# Patient Record
Sex: Male | Born: 1955
Health system: Southern US, Community
[De-identification: ages and names within clinical notes are randomized; demographics above are authoritative.]

## PROBLEM LIST (undated history)

## (undated) DIAGNOSIS — H269 Unspecified cataract: Secondary | ICD-10-CM

## (undated) DIAGNOSIS — G4730 Sleep apnea, unspecified: Secondary | ICD-10-CM

## (undated) DIAGNOSIS — C801 Malignant (primary) neoplasm, unspecified: Secondary | ICD-10-CM

## (undated) DIAGNOSIS — I10 Essential (primary) hypertension: Secondary | ICD-10-CM

## (undated) DIAGNOSIS — C8590 Non-Hodgkin lymphoma, unspecified, unspecified site: Secondary | ICD-10-CM

## (undated) HISTORY — DX: Sleep apnea, unspecified: G47.30

## (undated) HISTORY — DX: Non-Hodgkin lymphoma, unspecified, unspecified site: C85.90

## (undated) HISTORY — DX: Essential (primary) hypertension: I10

## (undated) HISTORY — PX: HERNIA REPAIR: SHX51

## (undated) HISTORY — DX: Unspecified cataract: H26.9

---

## 2004-10-31 ENCOUNTER — Encounter (INDEPENDENT_AMBULATORY_CARE_PROVIDER_SITE_OTHER): Payer: Self-pay | Admitting: *Deleted

## 2004-10-31 ENCOUNTER — Inpatient Hospital Stay (HOSPITAL_COMMUNITY): Admission: RE | Admit: 2004-10-31 | Discharge: 2004-11-02 | Payer: Self-pay | Admitting: Otolaryngology

## 2004-11-07 ENCOUNTER — Ambulatory Visit: Payer: Self-pay | Admitting: Oncology

## 2004-11-21 ENCOUNTER — Ambulatory Visit (HOSPITAL_COMMUNITY): Admission: RE | Admit: 2004-11-21 | Discharge: 2004-11-21 | Payer: Self-pay | Admitting: Oncology

## 2004-11-30 ENCOUNTER — Ambulatory Visit (HOSPITAL_COMMUNITY): Admission: RE | Admit: 2004-11-30 | Discharge: 2004-11-30 | Payer: Self-pay | Admitting: Oncology

## 2005-01-19 ENCOUNTER — Ambulatory Visit: Payer: Self-pay | Admitting: Oncology

## 2005-03-16 ENCOUNTER — Ambulatory Visit: Payer: Self-pay | Admitting: Oncology

## 2005-06-15 ENCOUNTER — Ambulatory Visit: Payer: Self-pay | Admitting: Oncology

## 2005-09-14 ENCOUNTER — Ambulatory Visit: Payer: Self-pay | Admitting: Oncology

## 2005-11-13 ENCOUNTER — Ambulatory Visit: Payer: Self-pay | Admitting: Oncology

## 2006-04-12 ENCOUNTER — Ambulatory Visit: Payer: Self-pay | Admitting: Oncology

## 2006-04-16 LAB — CBC WITH DIFFERENTIAL/PLATELET
BASO%: 0.3 % (ref 0.0–2.0)
Basophils Absolute: 0 10*3/uL (ref 0.0–0.1)
EOS%: 1.8 % (ref 0.0–7.0)
Eosinophils Absolute: 0.1 10*3/uL (ref 0.0–0.5)
HCT: 40.1 % (ref 38.7–49.9)
HGB: 13.8 g/dL (ref 13.0–17.1)
LYMPH%: 24.1 % (ref 14.0–48.0)
MCH: 31.6 pg (ref 28.0–33.4)
MCHC: 34.5 g/dL (ref 32.0–35.9)
MCV: 91.8 fL (ref 81.6–98.0)
MONO#: 0.5 10*3/uL (ref 0.1–0.9)
MONO%: 9.6 % (ref 0.0–13.0)
NEUT#: 3.3 10*3/uL (ref 1.5–6.5)
NEUT%: 64.2 % (ref 40.0–75.0)
Platelets: 236 10*3/uL (ref 145–400)
RBC: 4.37 10*6/uL (ref 4.20–5.71)
RDW: 13.6 % (ref 11.2–14.6)
WBC: 5.1 10*3/uL (ref 4.0–10.0)
lymph#: 1.2 10*3/uL (ref 0.9–3.3)

## 2006-08-09 ENCOUNTER — Ambulatory Visit: Payer: Self-pay | Admitting: Oncology

## 2006-08-13 LAB — CBC WITH DIFFERENTIAL/PLATELET
BASO%: 0.4 % (ref 0.0–2.0)
Basophils Absolute: 0 10*3/uL (ref 0.0–0.1)
EOS%: 3.1 % (ref 0.0–7.0)
Eosinophils Absolute: 0.1 10*3/uL (ref 0.0–0.5)
HCT: 42.3 % (ref 38.7–49.9)
HGB: 14.8 g/dL (ref 13.0–17.1)
LYMPH%: 30.9 % (ref 14.0–48.0)
MCH: 32 pg (ref 28.0–33.4)
MCHC: 34.9 g/dL (ref 32.0–35.9)
MCV: 91.6 fL (ref 81.6–98.0)
MONO#: 0.4 10*3/uL (ref 0.1–0.9)
MONO%: 12.4 % (ref 0.0–13.0)
NEUT#: 1.9 10*3/uL (ref 1.5–6.5)
NEUT%: 53.2 % (ref 40.0–75.0)
Platelets: 196 10*3/uL (ref 145–400)
RBC: 4.61 10*6/uL (ref 4.20–5.71)
RDW: 13.7 % (ref 11.2–14.6)
WBC: 3.6 10*3/uL — ABNORMAL LOW (ref 4.0–10.0)
lymph#: 1.1 10*3/uL (ref 0.9–3.3)

## 2006-08-13 LAB — LACTATE DEHYDROGENASE: LDH: 141 U/L (ref 94–250)

## 2006-12-05 ENCOUNTER — Ambulatory Visit: Payer: Self-pay | Admitting: Oncology

## 2006-12-10 LAB — CBC WITH DIFFERENTIAL/PLATELET
BASO%: 0.4 % (ref 0.0–2.0)
Basophils Absolute: 0 10*3/uL (ref 0.0–0.1)
EOS%: 2.6 % (ref 0.0–7.0)
Eosinophils Absolute: 0.1 10*3/uL (ref 0.0–0.5)
HCT: 43.7 % (ref 38.7–49.9)
HGB: 15 g/dL (ref 13.0–17.1)
LYMPH%: 28.7 % (ref 14.0–48.0)
MCH: 31.8 pg (ref 28.0–33.4)
MCHC: 34.3 g/dL (ref 32.0–35.9)
MCV: 92.8 fL (ref 81.6–98.0)
MONO#: 0.5 10*3/uL (ref 0.1–0.9)
MONO%: 11.2 % (ref 0.0–13.0)
NEUT#: 2.6 10*3/uL (ref 1.5–6.5)
NEUT%: 57.1 % (ref 40.0–75.0)
Platelets: 206 10*3/uL (ref 145–400)
RBC: 4.71 10*6/uL (ref 4.20–5.71)
RDW: 13.3 % (ref 11.2–14.6)
WBC: 4.6 10*3/uL (ref 4.0–10.0)
lymph#: 1.3 10*3/uL (ref 0.9–3.3)

## 2006-12-10 LAB — LACTATE DEHYDROGENASE: LDH: 144 U/L (ref 94–250)

## 2007-06-06 ENCOUNTER — Ambulatory Visit: Payer: Self-pay | Admitting: Oncology

## 2007-06-10 LAB — CBC WITH DIFFERENTIAL/PLATELET
Eosinophils Absolute: 0.1 10*3/uL (ref 0.0–0.5)
HCT: 39.2 % (ref 38.7–49.9)
LYMPH%: 26.2 % (ref 14.0–48.0)
MONO#: 0.4 10*3/uL (ref 0.1–0.9)
NEUT#: 2.6 10*3/uL (ref 1.5–6.5)
NEUT%: 61.1 % (ref 40.0–75.0)
Platelets: 190 10*3/uL (ref 145–400)
WBC: 4.3 10*3/uL (ref 4.0–10.0)

## 2007-10-08 ENCOUNTER — Ambulatory Visit: Payer: Self-pay | Admitting: Oncology

## 2007-10-13 LAB — COMPREHENSIVE METABOLIC PANEL
ALT: 16 U/L (ref 0–53)
BUN: 13 mg/dL (ref 6–23)
CO2: 25 mEq/L (ref 19–32)
Creatinine, Ser: 0.77 mg/dL (ref 0.40–1.50)
Total Bilirubin: 0.4 mg/dL (ref 0.3–1.2)

## 2007-10-13 LAB — CBC WITH DIFFERENTIAL/PLATELET
BASO%: 0.2 % (ref 0.0–2.0)
Basophils Absolute: 0 10*3/uL (ref 0.0–0.1)
HCT: 39.7 % (ref 38.7–49.9)
LYMPH%: 26.7 % (ref 14.0–48.0)
MCH: 32.4 pg (ref 28.0–33.4)
MCHC: 35.8 g/dL (ref 32.0–35.9)
MONO#: 0.4 10*3/uL (ref 0.1–0.9)
NEUT%: 59.3 % (ref 40.0–75.0)
Platelets: 183 10*3/uL (ref 145–400)
WBC: 4 10*3/uL (ref 4.0–10.0)

## 2007-10-13 LAB — LACTATE DEHYDROGENASE: LDH: 149 U/L (ref 94–250)

## 2008-01-09 ENCOUNTER — Ambulatory Visit: Payer: Self-pay | Admitting: Oncology

## 2008-03-31 ENCOUNTER — Ambulatory Visit: Payer: Self-pay | Admitting: Oncology

## 2008-03-31 LAB — CBC WITH DIFFERENTIAL/PLATELET
BASO%: 0.4 % (ref 0.0–2.0)
Basophils Absolute: 0 10*3/uL (ref 0.0–0.1)
EOS%: 1 % (ref 0.0–7.0)
Eosinophils Absolute: 0 10*3/uL (ref 0.0–0.5)
HCT: 40.9 % (ref 38.7–49.9)
HGB: 14.5 g/dL (ref 13.0–17.1)
LYMPH%: 24.8 % (ref 14.0–48.0)
MCH: 31.8 pg (ref 28.0–33.4)
MCHC: 35.4 g/dL (ref 32.0–35.9)
MCV: 89.8 fL (ref 81.6–98.0)
MONO#: 0.4 10*3/uL (ref 0.1–0.9)
MONO%: 10 % (ref 0.0–13.0)
NEUT#: 2.3 10*3/uL (ref 1.5–6.5)
NEUT%: 63.8 % (ref 40.0–75.0)
Platelets: 177 10*3/uL (ref 145–400)
RBC: 4.55 10*6/uL (ref 4.20–5.71)
RDW: 13.5 % (ref 11.2–14.6)
WBC: 3.6 10*3/uL — ABNORMAL LOW (ref 4.0–10.0)
lymph#: 0.9 10*3/uL (ref 0.9–3.3)

## 2008-04-01 LAB — COMPREHENSIVE METABOLIC PANEL
ALT: 20 U/L (ref 0–53)
AST: 21 U/L (ref 0–37)
Albumin: 4.7 g/dL (ref 3.5–5.2)
Alkaline Phosphatase: 41 U/L (ref 39–117)
BUN: 14 mg/dL (ref 6–23)
CO2: 22 mEq/L (ref 19–32)
Calcium: 9.6 mg/dL (ref 8.4–10.5)
Chloride: 106 mEq/L (ref 96–112)
Creatinine, Ser: 0.72 mg/dL (ref 0.40–1.50)
Glucose, Bld: 107 mg/dL — ABNORMAL HIGH (ref 70–99)
Potassium: 3.9 mEq/L (ref 3.5–5.3)
Sodium: 139 mEq/L (ref 135–145)
Total Bilirubin: 0.7 mg/dL (ref 0.3–1.2)
Total Protein: 7.4 g/dL (ref 6.0–8.3)

## 2008-04-01 LAB — LACTATE DEHYDROGENASE: LDH: 154 U/L (ref 94–250)

## 2008-04-01 LAB — HEPATITIS B SURFACE ANTIBODY,QUALITATIVE: Hep B S Ab: NEGATIVE

## 2008-04-01 LAB — HEPATITIS B SURFACE ANTIGEN: Hepatitis B Surface Ag: NEGATIVE

## 2008-04-01 LAB — HEPATITIS B CORE ANTIBODY, TOTAL: Hep B Core Total Ab: NEGATIVE

## 2008-04-05 ENCOUNTER — Ambulatory Visit (HOSPITAL_COMMUNITY): Admission: RE | Admit: 2008-04-05 | Discharge: 2008-04-05 | Payer: Self-pay | Admitting: Oncology

## 2008-04-23 LAB — CBC WITH DIFFERENTIAL/PLATELET
BASO%: 0.8 % (ref 0.0–2.0)
Basophils Absolute: 0 10*3/uL (ref 0.0–0.1)
EOS%: 3 % (ref 0.0–7.0)
Eosinophils Absolute: 0.1 10*3/uL (ref 0.0–0.5)
HCT: 41.4 % (ref 38.7–49.9)
HGB: 14.5 g/dL (ref 13.0–17.1)
LYMPH%: 25.2 % (ref 14.0–48.0)
MCH: 31.3 pg (ref 28.0–33.4)
MCHC: 34.9 g/dL (ref 32.0–35.9)
MCV: 89.7 fL (ref 81.6–98.0)
MONO#: 0.3 10*3/uL (ref 0.1–0.9)
MONO%: 9.5 % (ref 0.0–13.0)
NEUT#: 2.1 10*3/uL (ref 1.5–6.5)
NEUT%: 61.5 % (ref 40.0–75.0)
Platelets: 175 10*3/uL (ref 145–400)
RBC: 4.61 10*6/uL (ref 4.20–5.71)
RDW: 12.3 % (ref 11.2–14.6)
WBC: 3.4 10*3/uL — ABNORMAL LOW (ref 4.0–10.0)
lymph#: 0.9 10*3/uL (ref 0.9–3.3)

## 2008-04-23 LAB — PSA: PSA: 1.76 ng/mL (ref 0.10–4.00)

## 2008-05-18 ENCOUNTER — Ambulatory Visit: Payer: Self-pay | Admitting: Oncology

## 2008-05-20 LAB — CBC WITH DIFFERENTIAL/PLATELET
BASO%: 0.3 % (ref 0.0–2.0)
Basophils Absolute: 0 10*3/uL (ref 0.0–0.1)
EOS%: 2.3 % (ref 0.0–7.0)
Eosinophils Absolute: 0.1 10*3/uL (ref 0.0–0.5)
HCT: 40.9 % (ref 38.7–49.9)
HGB: 14.4 g/dL (ref 13.0–17.1)
LYMPH%: 28.8 % (ref 14.0–48.0)
MCH: 31.7 pg (ref 28.0–33.4)
MCHC: 35.1 g/dL (ref 32.0–35.9)
MCV: 90.4 fL (ref 81.6–98.0)
MONO#: 0.5 10*3/uL (ref 0.1–0.9)
MONO%: 13.1 % — ABNORMAL HIGH (ref 0.0–13.0)
NEUT#: 1.9 10*3/uL (ref 1.5–6.5)
NEUT%: 55.5 % (ref 40.0–75.0)
Platelets: 186 10*3/uL (ref 145–400)
RBC: 4.52 10*6/uL (ref 4.20–5.71)
RDW: 13.4 % (ref 11.2–14.6)
WBC: 3.5 10*3/uL — ABNORMAL LOW (ref 4.0–10.0)
lymph#: 1 10*3/uL (ref 0.9–3.3)

## 2008-06-25 ENCOUNTER — Inpatient Hospital Stay (HOSPITAL_COMMUNITY): Admission: EM | Admit: 2008-06-25 | Discharge: 2008-06-28 | Payer: Self-pay | Admitting: Emergency Medicine

## 2008-06-28 ENCOUNTER — Encounter (INDEPENDENT_AMBULATORY_CARE_PROVIDER_SITE_OTHER): Payer: Self-pay | Admitting: Gastroenterology

## 2008-07-19 ENCOUNTER — Ambulatory Visit: Payer: Self-pay | Admitting: Oncology

## 2008-07-20 LAB — CBC WITH DIFFERENTIAL/PLATELET
BASO%: 0.7 % (ref 0.0–2.0)
Basophils Absolute: 0 10*3/uL (ref 0.0–0.1)
EOS%: 4.2 % (ref 0.0–7.0)
Eosinophils Absolute: 0.1 10*3/uL (ref 0.0–0.5)
HCT: 35.1 % — ABNORMAL LOW (ref 38.7–49.9)
HGB: 12.2 g/dL — ABNORMAL LOW (ref 13.0–17.1)
LYMPH%: 23.6 % (ref 14.0–48.0)
MCH: 33.5 pg — ABNORMAL HIGH (ref 28.0–33.4)
MCHC: 34.8 g/dL (ref 32.0–35.9)
MCV: 96.2 fL (ref 81.6–98.0)
MONO#: 0.3 10*3/uL (ref 0.1–0.9)
MONO%: 11.7 % (ref 0.0–13.0)
NEUT#: 1.3 10*3/uL — ABNORMAL LOW (ref 1.5–6.5)
NEUT%: 59.8 % (ref 40.0–75.0)
Platelets: 172 10*3/uL (ref 145–400)
RBC: 3.65 10*6/uL — ABNORMAL LOW (ref 4.20–5.71)
RDW: 15.8 % — ABNORMAL HIGH (ref 11.2–14.6)
WBC: 2.2 10*3/uL — ABNORMAL LOW (ref 4.0–10.0)
lymph#: 0.5 10*3/uL — ABNORMAL LOW (ref 0.9–3.3)

## 2008-10-19 ENCOUNTER — Ambulatory Visit: Payer: Self-pay | Admitting: Oncology

## 2008-10-22 LAB — CBC WITH DIFFERENTIAL/PLATELET
BASO%: 0.4 % (ref 0.0–2.0)
Basophils Absolute: 0 10*3/uL (ref 0.0–0.1)
EOS%: 1.9 % (ref 0.0–7.0)
Eosinophils Absolute: 0.1 10*3/uL (ref 0.0–0.5)
HCT: 42.2 % (ref 38.7–49.9)
HGB: 14.6 g/dL (ref 13.0–17.1)
LYMPH%: 24.1 % (ref 14.0–48.0)
MCH: 31.2 pg (ref 28.0–33.4)
MCHC: 34.5 g/dL (ref 32.0–35.9)
MCV: 90.5 fL (ref 81.6–98.0)
MONO#: 0.4 10*3/uL (ref 0.1–0.9)
MONO%: 10.6 % (ref 0.0–13.0)
NEUT#: 2.3 10*3/uL (ref 1.5–6.5)
NEUT%: 63 % (ref 40.0–75.0)
Platelets: 183 10*3/uL (ref 145–400)
RBC: 4.67 10*6/uL (ref 4.20–5.71)
RDW: 13 % (ref 11.2–14.6)
WBC: 3.6 10*3/uL — ABNORMAL LOW (ref 4.0–10.0)
lymph#: 0.9 10*3/uL (ref 0.9–3.3)

## 2009-01-19 ENCOUNTER — Ambulatory Visit: Payer: Self-pay | Admitting: Oncology

## 2009-05-19 ENCOUNTER — Ambulatory Visit: Payer: Self-pay | Admitting: Oncology

## 2009-11-04 ENCOUNTER — Ambulatory Visit: Payer: Self-pay | Admitting: Oncology

## 2010-05-05 ENCOUNTER — Ambulatory Visit: Payer: Self-pay | Admitting: Oncology

## 2011-01-09 ENCOUNTER — Encounter (HOSPITAL_BASED_OUTPATIENT_CLINIC_OR_DEPARTMENT_OTHER): Payer: BC Managed Care – PPO | Admitting: Oncology

## 2011-01-09 DIAGNOSIS — C8299 Follicular lymphoma, unspecified, extranodal and solid organ sites: Secondary | ICD-10-CM

## 2011-04-10 NOTE — Consult Note (Signed)
[name], Derek Brown NO.:  [id number]   MEDICAL RECORD NO.:  [id number]          PATIENT TYPE:  EMS   LOCATION:  ED                           FACILITY:  [facility]   PHYSICIAN:  Llana Aliment. Randa Evens, M.D. DATE OF BIRTH:  11-02-1956   DATE OF CONSULTATION:  06/25/2008  DATE OF DISCHARGE:                                 CONSULTATION   Derek Brown presented to the [facility] Emergency Room this morning with  5+ episodes of bright red blood per rectum.  He has a history of non-  Hodgkin's lymphoma that has spread throughout his body cavity per his  wife.  He reports that he has never had any previous episodes of bloody  bowel movements.  He has had no abdominal pain, had no emesis but he  does report some chronic heartburn.   PAST MEDICAL HISTORY:  Significant for his non-Hodgkin's lymphoma first  diagnosed in 2005 as well as a recent diagnosis of BPH.  He was started  on IV lymphoma treatment in May.  So far the patient reports he has had  good results.   FAMILY HISTORY:  Significant for colon cancer in his grandfather as well  as ulcers in his uncles and cousins.   SOCIAL HISTORY:  He is married.  He exercises regularly.  He stopped  smoking over 10 years ago and denies any alcohol use.   ALLERGIES:  He has no known drug allergies.   CURRENT MEDICATIONS:  The patient does not remember his medications.  He  does say that he takes a daily 325 mg aspirin.  We called his primary  care physician, Dr. Lorenz Coaster, to obtain his list of medications.   REVIEW OF SYSTEMS:  Noncontributory.  He has had no recent illness.  No  heart palpitations.  No shortness of breath.  No pain.   PRIMARY CARE PHYSICIAN:  Dr. Leslee Home.   ONCOLOGIST:  Dr. Mancel Bale.   PHYSICAL EXAM:  His temperature is 97.5, pulse is 50, respirations are  20, blood pressure is 122/77.  He is alert and oriented, in no apparent distress.  He is pale and  mildly diaphoretic.  Heart is bradycardic with a  slight murmur.  Pulse is approximately 50.  Respirations are clear to auscultation.  Abdomen soft, nontender, nondistended with good bowel sounds.  Rectal was deferred.   The patient was found to be guaiac-positive by the ER physician.   CURRENT LABS:  Show a hemoglobin of 13.6, hematocrit 40, however, this  is before he received 2 IV boluses of normal saline with 20 mEq of  potassium.  Coags show a PT of 14.1, INR 1.1, BUN 13, creatinine 0.9,  platelets 214 thousand.   IMPRESSION:  Dr. Carman Ching has seen and examined the patient,  collected a history and reviewed his chart.  His impression is this is a  55 year old male with Hodgkin's lymphoma of currently experiencing a  lower gastrointestinal bleed, probably arterial and diverticular in  nature.  He presented to the ER orthostatic.  He is now in stable  condition.  Will  admit to the step-down unit.  Continue with supportive  care, IV hydration.  We will transfuse packed red blood cells as needed  and start Protonix.  We will plan for colonoscopy in the near future.  Dr. Dorena Cookey will see over the weekend.   Thanks very much for this consultation.      Stephani Police, PA    ______________________________  Llana Aliment Randa Evens, M.D.    MLY/MEDQ  D:  06/25/2008  T:  06/25/2008  Job:  [id number]   cc:   Fayrene Fearing L. Malon Kindle., M.D.  Fax: [phone]   Reuben Likes, M.D.  Fax: [phone]   Leighton Roach Truett Perna, M.D.  Fax: [phone]

## 2011-04-10 NOTE — Op Note (Signed)
[name], Derek Brown NO.:  [id number]   MEDICAL RECORD NO.:  [id number]          PATIENT TYPE:  INP   LOCATION:  1304                         FACILITY:  [facility]   PHYSICIAN:  Bernette Redbird, M.D.   DATE OF BIRTH:  [birth year]   DATE OF PROCEDURE:  06/28/2008  DATE OF DISCHARGE:                               OPERATIVE REPORT   PROCEDURE:  Colonoscopy with biopsy.   INDICATIONS:  A 55 year old gentleman who presented to the hospital  several days ago with a brisk lower GI bleed, on a baby aspirin daily,  history of non-Hodgkin's lymphoma apparently not felt to be relevant to  the current presentation based on his conversation with the patient's  oncologist.   Ironically, the patient was scheduled to have a screening colonoscopy  just a few days for now anyway.   FINDINGS:  Left side diverticulosis, mild to moderate.  No bleeding.  Diminutive sigmoid polyp.   PROCEDURE:  The risks of the procedure had been reviewed with the  patient who provided written consent, having undergone a prep as an  inpatient and having been brought from his hospital room to the  endoscopy unit.  Sedation was fentanyl 75 mcg and Versed 5 mg IV without  clinical instability, to a level of mild sedation.  Digital exam of the  prostate showed it to be smooth but perhaps slightly enlarged.   The Pentax pediatric video colonoscope was advanced around the colon to  the area just above the cecum, after which we were looping but this was  overcome by having the patient turn into the supine position.  The  terminal ileum was entered for short distance and appeared normal.  The  fecal effluent within it was completely nonbloody.   Pullback was then performed around the colon.  The quality of prep was  excellent and it is felt that all areas were adequately seen.   There was no blood whatsoever within the colonic lumen.   In the sigmoid region and to a lesser degree in the left:, the patient  had a fair number of diverticula, mostly small in size.   I did not see any alternative source of bleeding such as vascular  ectasia, colitis or large masses.   There was a diminutive polyp in the sigmoid colon removed by couple of  cold biopsies after probing it with forceps to make sure it was not an  inverted diverticulum.   Retroflexion in the rectum and reinspection of the rectum was  unremarkable.   The patient tolerated the procedure well and there no apparent  complications.   IMPRESSION.:  1. No active bleeding or blood present within the colonic or ileal      lumen at the time of this exam.  2. Mild to moderate sigmoid and left-sided diverticulosis,      constituting the probable origin of this patient's bleeding.  3. Diminutive sigmoid polyp biopsied.   PLAN:  1. Okay for discharge.  2. Await pathology on polyp with consideration for surveillance      colonoscopy in 5 years if the  polyp is adenomatous in character.           ______________________________  Bernette Redbird, M.D.     RB/MEDQ  D:  06/28/2008  T:  06/28/2008  Job:  [id number]   cc:   Jillyn Hidden B. Truett Perna, M.D.  Fax: [phone]   Reuben Likes, M.D.  Fax: [phone]

## 2011-04-10 NOTE — Discharge Summary (Signed)
[name], Derek Brown NO.:  [id number]   MEDICAL RECORD NO.:  [id number]          PATIENT TYPE:  INP   LOCATION:  1304                         FACILITY:  [facility]   PHYSICIAN:  Bernette Redbird, M.D.   DATE OF BIRTH:  [birth year]   DATE OF ADMISSION:  06/25/2008  DATE OF DISCHARGE:  06/28/2008                               DISCHARGE SUMMARY   ADMISSION DIAGNOSES:  1. Lower gastrointestinal bleed.  2. Non-Hodgkin's lymphoma.  3. Benign prostatic hypertrophy.   DISCHARGE DIAGNOSES:  1. Lower gastrointestinal bleed likely secondary to diverticulosis.  2. Non-Hodgkin's lymphoma.  3. Benign prostatic hypertrophy.   CONSULTATIONS:  None.   PROCEDURES:  Colonoscopy done June 28, 2008 by Dr. Molly Maduro Buccini findings include:  1. Mild to moderate left-sided diverticulosis.  2. Diminutive sigmoid polyp which was biopsied and pending.  3. No active bleeding or blood present in colonic or ileal lumen.   BRIEF HISTORY AND PHYSICAL/HOSPITAL COURSE:  The patient was admitted on  July 31.  He came to the [facility] ER, found to be orthostatic and  having had five to six bowel movements, painless bright red blood.  He  was bolused with IV fluids, given IV Protonix, admitted to step-down  unit.  He improved quickly overnight.  A regular diet was started on  August 1.  There was still some red blood per rectum,  but it was mixed  with brown stool.  At that time he underwent a colon prep on August 2  and colonoscopy today on August 3.  His hemoglobin bottomed out at 9.4.  He received no blood transfusions, but he was typed and crossed and  found to have O negative blood.  Labs on August 2 showed a hemoglobin of  5.6, hematocrit 27.68, white blood count 3.6, platelets 149,000, MCV  value 93.4.   CONDITION ON DISCHARGE:  Stable after colonoscopy.  Vital Signs:  Temperature 98, pulse 55, respirations 18, blood pressure is 117/71.   DISCHARGE INSTRUCTIONS:  1. No driving today.  2. Call [facility] office of [phone] with any signs of bleeding.  3. Light activity until after seen by Dr. Madilyn Fireman on August 7.  4. No aspirin until after seen by Dr. Madilyn Fireman on August 7.   DISCHARGE MEDICATIONS:  1. Allegra.  2. Vitamin C 1000 mg.  3. A prostate pill.  4. Aspirin 81 mg which will be held.   FOLLOW UP:  The patient has a follow-up office visit with Dr. Madilyn Fireman  August 7 at 3:30 p.m.      Stephani Police, PA    ______________________________  Bernette Redbird, M.D.    MLY/MEDQ  D:  06/28/2008  T:  06/28/2008  Job:  [id number]   cc:   Jillyn Hidden B. Truett Perna, M.D.  Fax: [phone]   Reuben Likes, M.D.  Fax: [phone]

## 2011-04-13 NOTE — Op Note (Signed)
[name], Derek Brown NO.:  [id number]   MEDICAL RECORD NO.:  [id number]          PATIENT TYPE:  OIB   LOCATION:  5735                         FACILITY:  [facility]   PHYSICIAN:  Hermelinda Medicus, M.D.   DATE OF BIRTH:  10-Aug-1956   DATE OF PROCEDURE:  10/31/2004  DATE OF DISCHARGE:                                 OPERATIVE REPORT   PREOPERATIVE DIAGNOSIS:  Hematoma of left postoperative parotid region.   POSTOPERATIVE DIAGNOSIS:  Hematoma of left postoperative parotid region.   OPERATION:  Exploration and removal of hematoma of left parotid region with  electrocoagulation using the bipolar and the Electrocap cautery to correct  any bleeding.   OPERATOR:  Hermelinda Medicus, M.D.   ANESTHESIA:  General endotracheal with Germaine Pomfret, M.D.   This patient in the early afternoon developed some swelling around his  parotid surgery region.  The JP apparently was not working and he developed  a hematoma.  We could not resolve this hematoma problem in the recovery  room, so we took him back to the operating room for resolution of this  problem.   PROCEDURE:  Patient placed in the supine position.  Under general  endotracheal anesthesia, he was intubated via flexible bronchoscopy  technique.  The patient then was prepped and draped using Betadine and the  usual head drape and neck drape was used.  The left parotid incision was  opened, stitches were removed, and a hematoma was found right at the  inferior aspect of the parotid surgery.  There was a small area of bleeding,  oozing in this area, and we electrocoagulated this area after finding the  branch of the facial nerve and guarding this very carefully.  We tested this  with a facial nerve stimulator to make absolutely sure we were seeing the  nerve accurately.  The superior and deeper areas were examined very  carefully, and no further bleeders were found.  There was some slight oozing  in certain areas superiorly  and at the deepest and most inferior aspect, and  these were electrocoagulated also.  The bipolar electrocautery was used  primarily and once this was brought under control, we irrigated the area  carefully.  We observed him also very carefully to make sure we had no  further bleeding problem.  The patient stated that he did have some  difficulties, having had a septal reconstruction several years ago, but it  was not a major problem with this.  Then looking underneath the inferior  laps, the anterior flap, and again in the inferior aspect, we irrigated and  found no further bleeding, we by sponge technique just to make sure there  was no evidence of any clotted bleeder in this area, and then we placed a  small Surgicel patch in this area where there was some bleeding on the bare  area of the parotid.  Once this was achieved, we then placed a new Al Pimple that was a better design and placed that in its original position, and  then we closed using chromic catgut 3-0 and  5-0 Ethilon.  The patient  tolerated the procedure very well and is doing well postoperatively.   His follow-up will be as an inpatient and then will be in five days and 10  days and two weeks, three weeks, and six weeks, three months, and a year.       JC/MEDQ  D:  10/31/2004  T:  11/01/2004  Job:  [id number]   cc:   Kristine Garbe. Ezzard Standing, M.D.  [address]  [place] [address]  Fax: [phone]

## 2011-04-13 NOTE — Op Note (Signed)
[name], Derek Brown NO.:  [id number]   MEDICAL RECORD NO.:  [id number]          PATIENT TYPE:  OIB   LOCATION:  5735                         FACILITY:  [facility]   PHYSICIAN:  Hermelinda Medicus, M.D.   DATE OF BIRTH:  [birth year]   DATE OF PROCEDURE:  DATE OF DISCHARGE:                                 OPERATIVE REPORT   PREOPERATIVE DIAGNOSES:  1.  Left parotid mass, inferior region.  2.  Right submaxillary gland region cervical node.   POSTOPERATIVE DIAGNOSES:  1.  Left parotid mass, inferior region.  2.  Right submaxillary gland region cervical node.   OPERATION PERFORMED:  1.  Left superficial parotidectomy with facial nerve dissection.  2.  Right submaxillary region node resection.   SURGEON:  Hermelinda Medicus, M.D.   ASSISTANT:  Kristine Garbe. Ezzard Standing, M.D.   ANESTHESIA:  General endotracheal with difficulty using the anesthesia  endoscope with Dr. Jean Rosenthal.   DESCRIPTION OF OPERATION:  With the patient in the supine position and under  general anesthesia where it was known that he was a difficult intubation, so  therefore the anesthesia laryngoscope was used and through this scope we  were able to intubate him without difficulty.  Once that was completed we  prepped for both sides; we prepped with Betadine.  We prepped his neck on  each side and then covered the right with a plan of the doing the  parotidectomy as the initial procedure.  The was exposed. The usual head  drape and neck drape, and operative drapes were used; and, the marking pen  was also used to set up the incisional site.   Then the parotid incision was carried out.  All hemostasis was established  with Bovie coagulation. We carried it down to the parotid.  We could easily  feel the inferior mass, which we felt was approximately 1.5 x 1.5 cm in size  at the inferior aspect.  We did our dissection posteriorly separating the  parotid tail away from the posterior scalenus muscles and we  elevated our  anterior facial flap.  We then found the tympanic bone of the mastoid and  the mastoid tip, and worked our way down the external ear canal cartilage  and bone down to the styloid process, and found the facial nerve.  Is was  carefully guarded and the dissection was then carried, watching the facial  nerve at all times, out laterally and anteriorly where we were able to work  around the tumor mass and guard the nerve very carefully.  We worked  anteriorly dissecting the abnormal tissue away inferiorly and anteriorly;  and, then watching the branches of the facial nerve we dissected the  anterior border.  We then carried the anterior parotid region to the tail of  the parotid and this was completely dissected not entering the tumor mass.   All hemostasis was established with Bovie electrocoagulation and bipolar  Bovie coagulation.  The branches of the nerve were all guarded and the  dissection of the superficial parotid was completed without.  All hemostasis  was then established  with Bovie electrocoagulation and the bipolar once  again.  The area was irrigated and then a #7 JP fluted drain was placed  through a separate incision.  This was sutured in place using 2-0 silk.  The  closure was then begun using chromic catgut 3-0 and then the skin was closed  using 5-0 Ethilon.  __________ completed the Steri-strips applied.  The  drain was secured and we then carefully turned the head back guarding the  drain and the incision to up the right side.  The angle of the mandible was  marked as well as the anterior mandible, midline, and then two  fingerbreadths below the level of the horizontal ramus an incision was made.  This was approximately 4 cm in size.  We worked our way down through the fat  and platysma and then worked our way directly towards the inferior  submaxillary gland.  The dissection was carried up along the lateral  submaxillary gland and then the node was found and  this was dissected free  without difficulty.  All hemostasis was established with the Bovie  electrocoagulation.  This was closed using a small Penrose drain.  Closure  was with 2-0 chromic catgut and 5-0 Ethilon.  Once this was completed  dressings were applied on both sides.   The patient was taken to the recovery room in good condition, facial nerve  branches in good condition and the patient doing well.   Follow up will be overnight as he has a history of sleep apnea, which has  already been corrected by surgery; and, we will just watch him because of  his Jackson-Pratt drain.   The patient tolerated the procedure well as noted above and is further  follow up will then be one wee, three weeks and six weeks.       JC/MEDQ  D:  10/31/2004  T:  11/01/2004  Job:  [id number]   cc:   Reuben Likes, M.D.  [address]  [place]  [place] [address]  Fax: [phone]   Kristine Garbe. Ezzard Standing, M.D.  [address]  [place] [address]  Fax: [phone]

## 2011-04-13 NOTE — Discharge Summary (Signed)
[name], Derek Brown NO.:  [id number]   MEDICAL RECORD NO.:  [id number]          PATIENT TYPE:  INP   LOCATION:                               FACILITY:  [facility]   PHYSICIAN:  Hermelinda Medicus, M.D.   DATE OF BIRTH:  June 13, 1956   DATE OF ADMISSION:  10/31/2004  DATE OF DISCHARGE:  11/02/2004                                 DISCHARGE SUMMARY   This patient is a 55 year old male who has had essentially two neck masses.  He had been on antibiotics on several occasions trying to treat this but  they have not changed.  The first one was a left inferior posterior parotid  mass, the second one in the right submaxillary that appears to be a lymph  node.  CAT scan was done showing an 11 x 13 mm circumscribed enhancing mass  in the superficial left parotid which has shown no change on antibiotic and  appears to be within the superficial parotid gland.  The right showed a 17 x  12 submandibular lymph node that was lateral to the submandibular gland and  was again unchanged on antibiotic care.  The patient thinks it is increasing  in size.  He had a small submental lymph nodes demonstrated that were 7 mm  or less.  Patient is in excellent health and biopsy excision was the next  plan.  The plan was to do a left parotidectomy, superficial parotidectomy,  and to do a right lymph node biopsy.  It was felt by CAT scan this would be  a primary parotid tumor, though lymphoma or other tumor could not be ruled  out.   PAST MEDICAL HISTORY:  Quite unremarkable.   ALLERGIES:  None.   SOCIAL HISTORY:  Does not drink or smoke.  He did smoke one pack a day, quit  in 1981.  He had a right inguinal and umbilical hernia repair eight to nine  years ago, had septal surgery, LAUP,  with a fairly difficult intubation,  with a short mandible about two years ago.   REVIEW OF SYSTEMS:  All within normal limits.  GU/GI within normal limits.  He has been in excellent health.  He does have the sleep  apnea history which  was well corrected two years ago.   PHYSICAL EXAMINATION:  VITAL SIGNS:  Blood pressure 129/79, heart rate 58,  weight 195, temperature 97.  HEENT:  Ears are clear.  Tympanic membranes are clear.  Nose is clear.  Septum is straight.  Oral cavity is clear.  True cords, false cords,  epiglottis, base of tongue are clear to ulceration or mass.  The nasopharynx  is clear.  True cord mobility, gag reflex, tongue mobility, EOMs, facial  nerve are all symmetrical.  No evidence of any laryngeal, base of tongue,  nasopharyngeal malignancy.  NECK:  Right submandibular region superficial node easily palpable, somewhat  mobile.  The neck shows an inferior posterior parotid mass that is fairly  small.  Feels like increasing size, part of the parotid gland unresponsive  to antibiotic.  No other thyromegaly or cervical  adenopathy or mass.  CHEST:  Clear.  No rales, rhonchi or wheezes.  CARDIOVASCULAR:  No rubs, murmurs or gallops.  ABDOMEN:  Unremarkable.  EXTREMITIES:  Unremarkable.   INITIAL DIAGNOSES:  Left parotid inferior region mass, right submaxillary  gland cervical node unresponsive to antibiotics.  Our plan is to do a  superficial parotidectomy and a right and left and a right submaxillary  gland node biopsy.  The patient underwent this surgery October 31, 2004.  He  had a malfunctioning JP drain and had some fluid collection beneath the left  parotid with a left parotid hematoma formation.  This was drained.  We re-  explored that parotid region, removed the hematoma, and used  electrocoagulation to correct any small hemorrhagic problems.  No real  notable bleeder was found.  The 7th nerve was intact pre surgery and post  surgery.  The patient did well postoperatively.  The new Jackson-Pratt that  was placed was removed on December 8 and the patient was discharged on that  day.  Patient doing very well.  Facial nerve intact.  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  The patient was referred to the radiation oncology and  chemotherapy department and is now under their care.  He has done very well.  I have followed him in the office.  Sutures were removed.  Facial nerve is  in good condition and he is continuing very well with his therapy.  His  laboratory work was quite unremarkable.  His EKG showed sinus bradycardia.  His urinalysis was within normal limits.  Hemoglobin 14.7, hematocrit 43.6,  white count 4.0.  The patient has done extremely well and would be expected  to do very well postoperatively and post radiation therapy.       ___________________________________________  Hermelinda Medicus, M.D.    JC/MEDQ  D:  01/02/2005  T:  01/02/2005  Job:  [id number]   cc:   Kristine Garbe. Ezzard Standing, M.D.  [address]  [place] [address]  Fax: [phone]   Elmer Sow. Dorna Bloom, M.D.  Fax: [phone]   Reuben Likes, M.D.  [address]  [place]  [place] [address]  Fax: [phone]

## 2011-04-13 NOTE — H&P (Signed)
NAME:  ACE, BERGFELD NO.:  [id number]   MEDICAL RECORD NO.:  [id number]          PATIENT TYPE:  OIB   LOCATION:  2899                         FACILITY:  [facility]   PHYSICIAN:  Hermelinda Medicus, M.D.   DATE OF BIRTH:  1956-02-12   DATE OF ADMISSION:  10/31/2004  DATE OF DISCHARGE:                                HISTORY & PHYSICAL   HISTORY OF PRESENT ILLNESS:  This patient is a 55 year old male who has had  essentially 2 neck masses.  He has been on antibiotics on several occasions,  trying to get these to shrink and they have essentially not changed.  His  first one is a left inferoposterior parotid mass and the second one is a  right submaxillary, what appears to be a node.  The CAT scan was done and it  shows an 11 x 13-mm circumscribed enhancing mass in the superficial left  parotid gland which has shown no change on antibiotic use and seemed to be  within the superficial parotid gland, the left.  He also has an enlarged 17  x 12-mm right submandibular lymph node which is lateral to the submandibular  gland, but this is 17 x 12 mm, is unchanged and he has palpated it on  several occasions too and thinks it is increasing in size.  Small submental  lymph nodes were also demonstrated, but all were 7 mm or less.  The patient  is in excellent health and now enters for removal of this right node for  biopsy purposes and the left one, he will have a left parotidectomy  inferiorly to remove this, what appears to be most like a Warthin's or mixed  tumor.   PAST HISTORY:  His past history is quite unremarkable.   ALLERGIES:  He has no allergies to medications.   SOCIAL HISTORY:  He does not drink; he did smoke 1 pack a day for 10 years,  quit in 1981.   PAST SURGICAL HISTORY:  1.  He has had a right inguinal and umbilical hernia repair 8-9 years ago.  2.  Had septal surgery and LAUP with a difficult intubation -- he has a fair      short mandible -- 2 years ago.   REVIEW OF SYSTEMS:  CARDIOVASCULAR, PULMONARY AND NEUROLOGICAL:  All within  normal limits.  GU AND GI:  Within normal limits.  He has done really quite  well.  He does have a history of sleep apnea which has been quite well-  corrected 2 years ago.   PHYSICAL EXAMINATION:  VITAL SIGNS:  On physical examination, blood pressure  is 129/79, heart rate 58, he weighs 195, temperature 97.  HEENT:  Ears are clear.  Tympanic membranes are clear.  The nose is clear.  The septum is straight now and his oral cavity is also clear.  True cords,  false cords, epiglottis and base of tongue are clear of ulceration or mass.  Nasopharynx is clear.  True cord mobility, gag reflex, tongue mobility,  EOMs, facial nerves are all symmetrical.  NECK:  The neck reveals a  right submandibular region superficial node quite  easily palpable and is somewhat mobile, but really quite firm.  The left  shows an inferoposterior parotid mass that is fairly small, but he feels is  increasing in size and is part of the parotid gland, unresponsive to  antibiotic.  The remainder of the neck is free of any thyromegaly or other  cervical adenopathy or other mass.  CHEST:  Chest is clear, no rales, rhonchi or wheezes.  CARDIOVASCULAR:  No opening snaps, murmurs or gallops.  ABDOMEN:  Abdomen free of any organomegaly, tenderness or mass.  EXTREMITIES:  Extremities unremarkable.   INITIAL DIAGNOSIS:  Left parotid inferior region mass with a right  submaxillary gland cervical node unresponsive to antibiotics.  The patient  is aware that the ramus mandibularis would be at risk on the right side when  we do this node biopsy and that the facial nerve on the left side would be  evaluated and will be close to our dissection on the left during the parotid  surgery.  The patient also has had anesthesia consult and will be intubated  with the anesthesia laryngoscope as he was recorded to be a difficult  intubation in the past.    DIAGNOSES:  1.  Right submaxillary gland lymphadenopathy.  2.  Left parotid mass.  3.  Difficult intubation.  4.  History of umbilical hernia.  5.  History of right inguinal hernia.  6.  History of nasal surgery, septal reconstruction and laser-assisted      uvulopalatoplasty 2 years ago.       JC/MEDQ  D:  10/31/2004  T:  10/31/2004  Job:  [id number]   cc:   Reuben Likes, M.D.  [address]  [place]  [place] [address]  Fax: [phone]

## 2011-08-24 LAB — DIFFERENTIAL
Eosinophils Relative: 1
Lymphocytes Relative: 20
Lymphs Abs: 2.1
Monocytes Absolute: 0.9
Monocytes Relative: 9

## 2011-08-24 LAB — CBC
HCT: 26.8 — ABNORMAL LOW
HCT: 28.7 — ABNORMAL LOW
HCT: 38.7 — ABNORMAL LOW
Hemoglobin: 10.1 — ABNORMAL LOW
Hemoglobin: 10.2 — ABNORMAL LOW
Hemoglobin: 13.4
Hemoglobin: 9.8 — ABNORMAL LOW
MCHC: 35
MCHC: 35.1
MCHC: 35.2
MCHC: 35.5
MCV: 92.9
MCV: 93.6
MCV: 93.4
MCV: 93.8
MCV: 93.9
Platelets: 162
Platelets: 149 — ABNORMAL LOW
Platelets: 157
Platelets: 164
RBC: 3.07 — ABNORMAL LOW
RBC: 3.15 — ABNORMAL LOW
RBC: 4.16 — ABNORMAL LOW
RBC: 2.96 — ABNORMAL LOW
RBC: 2.98 — ABNORMAL LOW
RBC: 2.98 — ABNORMAL LOW
RDW: 13.4
RDW: 13.8
RDW: 14
WBC: 10.5
WBC: 6.5
WBC: 8.4
WBC: 3.6 — ABNORMAL LOW
WBC: 3.7 — ABNORMAL LOW

## 2011-08-24 LAB — POCT I-STAT, CHEM 8
BUN: 13
Calcium, Ion: 1.14
Chloride: 105
Creatinine, Ser: 0.9
Glucose, Bld: 154 — ABNORMAL HIGH
HCT: 40

## 2011-08-24 LAB — COMPREHENSIVE METABOLIC PANEL
AST: 16
Albumin: 2.9 — ABNORMAL LOW
Chloride: 110
Creatinine, Ser: 0.76
GFR calc Af Amer: >60
Potassium: 4.2
Total Bilirubin: 0.5
Total Protein: 4.8 — ABNORMAL LOW

## 2011-08-24 LAB — PROTIME-INR: Prothrombin Time: 14.1

## 2011-08-24 LAB — LACTIC ACID, PLASMA: Lactic Acid, Venous: 1.8

## 2011-08-24 LAB — CROSSMATCH: ABO/RH(D): O NEG

## 2011-08-24 LAB — ABO/RH: ABO/RH(D): O NEG

## 2011-09-11 ENCOUNTER — Encounter (HOSPITAL_BASED_OUTPATIENT_CLINIC_OR_DEPARTMENT_OTHER): Payer: BC Managed Care – PPO | Admitting: Oncology

## 2011-09-11 DIAGNOSIS — Z23 Encounter for immunization: Secondary | ICD-10-CM

## 2011-09-11 DIAGNOSIS — C8299 Follicular lymphoma, unspecified, extranodal and solid organ sites: Secondary | ICD-10-CM

## 2012-01-24 ENCOUNTER — Other Ambulatory Visit: Payer: Self-pay | Admitting: Dermatology

## 2012-01-28 ENCOUNTER — Telehealth: Payer: Self-pay | Admitting: *Deleted

## 2012-01-28 NOTE — Telephone Encounter (Signed)
01/28/2012 10:41am Left message on patient's cell phone voice mail requesting that he call me back at [phone] for a status update, regarding any health problems or hospitalizations in the past three months.

## 2012-01-29 ENCOUNTER — Telehealth: Payer: Self-pay | Admitting: *Deleted

## 2012-01-29 NOTE — Telephone Encounter (Signed)
Received return call from patient this morning stating that he is doing very well. He has had no health problems, hospitalizations or new diagnosis of cancer in the past three months. Confirmed Derek Brown appointment with Dr. Truett Perna for Monday, June 17th at 10:30am.

## 2012-02-05 ENCOUNTER — Telehealth: Payer: Self-pay | Admitting: *Deleted

## 2012-02-05 NOTE — Telephone Encounter (Signed)
Patient called to report scalp lesion removed on 01/24/12 returned as lymphoma. Path report received today from Dr. Donzetta Starch showing low grade follicular lymphoma. Patient asking if he needs to be seen before his June 17 th appointment. Is having no s/s of lymphoma.

## 2012-05-12 ENCOUNTER — Ambulatory Visit (HOSPITAL_BASED_OUTPATIENT_CLINIC_OR_DEPARTMENT_OTHER): Payer: PRIVATE HEALTH INSURANCE | Admitting: Oncology

## 2012-05-12 VITALS — BP 114/67 | HR 50 | Temp 96.9°F | Ht 73.0 in | Wt 190.2 lb

## 2012-05-12 DIAGNOSIS — C8589 Other specified types of non-Hodgkin lymphoma, extranodal and solid organ sites: Secondary | ICD-10-CM

## 2012-05-12 NOTE — Progress Notes (Signed)
   [facility]    OFFICE PROGRESS NOTE   INTERVAL HISTORY:   He returns as scheduled. He feels well. No fever, night sweats, or anorexia. No palpable lymph nodes.  He noted a lesion at the left parietal scalp several months ago. Dr. Yetta Barre excised this lesion and the pathology returned consistent with lymphoma (we do not have the pathology report available today). He reports no regrowth of the lesion.  His wife reports that his snoring is worse.  Objective:  Vital signs in last 24 hours:  Blood pressure 114/67, pulse 50, temperature 96.9 F (36.1 C), temperature source Oral, height 6\' 1"  (1.854 m), weight 190 lb 3.2 oz (86.274 kg).    HEENT: Oropharynx without visible mass, neck without mass, no palpable or visible lesion over the parietal scalp Lymphatics: No cervical, supraclavicular, axillary, or inguinal nodes. Fullness in the left inguinal region without a discrete lymph node Resp: Lungs clear bilaterally Cardio: Regular rate and rhythm GI: No hepatosplenomegaly Vascular: No leg edema    Lab Results: None today   Medications: I have reviewed the patient's current medications.  Assessment/Plan: 1. Low-grade follicular lymphoma, status post 4 weeks of rituximab therapy.  The rituximab was last given on 04/30/2008.  The palpable lymphadenopathy resolved following rituximab therapy. 2. Benign prostatic hypertrophy. 3. Admission with a lower gastrointestinal bleed, status post a colonoscopy revealing diverticulosis in August 2009. 4. History of mild neutropenia related to rituximab. 5. Removal of a left parietal scalp lesion in 2013 with the pathology confirming non-Hodgkin's lymphoma   Disposition:  There is no clinical evidence for disease progression. He remains asymptomatic from the non-Hodgkin's lymphoma. I will followup on the pathology from the left scalp lesion. The plan is to continue an observation approach. Mr. Becker will return for an office  visit in 6 months. He will contact us in the interim for new symptoms.  He now has insurance through [facility]. It is cheaper for him to obtain medical care at [facility]. He would like to arrange for an oncology physician at [facility] in case he needs treatment in the future. We will make a referral to the medical oncology service at [facility].   Thornton Papas, MD  05/12/2012  5:13 PM

## 2012-05-14 ENCOUNTER — Telehealth: Payer: Self-pay | Admitting: Oncology

## 2012-05-14 NOTE — Telephone Encounter (Signed)
called pts lmovm for appts in dec2013. also took a copy of referral to medical recs to schedule appt @ [facility]

## 2012-05-15 ENCOUNTER — Telehealth: Payer: Self-pay | Admitting: Oncology

## 2012-05-15 NOTE — Telephone Encounter (Signed)
Pt scheduled to see Dr. Greggory Stallion 7/26 @ 2:45. Pt aware of appt.

## 2012-05-19 ENCOUNTER — Telehealth: Payer: Self-pay | Admitting: Oncology

## 2012-05-19 NOTE — Telephone Encounter (Signed)
Pt. Cannot make the 06/23/12 appt. With Dr. Greggory Stallion at [facility]. I gave him the [facility] phone number he is going to reschedule his appt.

## 2012-07-19 DIAGNOSIS — C8211 Follicular lymphoma grade II, lymph nodes of head, face, and neck: Secondary | ICD-10-CM | POA: Insufficient documentation

## 2012-07-30 ENCOUNTER — Telehealth: Payer: Self-pay | Admitting: *Deleted

## 2012-07-30 NOTE — Telephone Encounter (Signed)
Spoke with patient by phone today who states that his health has been well and that he has no new health problems to report. Patient states that he was referred to Dr. Greggory Stallion at [facility] and was seen about two weeks ago, with plans for routine follow-up visits to continue with Dr. Truett Perna. He would be referred back to [facility] should any treatment be needed, due to insurance cost differences.  Thanked patient for his participation in the study, noting that follow-up will be ending in approximately March 2014. Patient expressed an interest in learning more about results from the study; inquiry sent to site monitor at [employer] to inquire about information for patients.

## 2012-11-13 ENCOUNTER — Ambulatory Visit (HOSPITAL_BASED_OUTPATIENT_CLINIC_OR_DEPARTMENT_OTHER): Payer: PRIVATE HEALTH INSURANCE | Admitting: Oncology

## 2012-11-13 ENCOUNTER — Encounter: Payer: Self-pay | Admitting: *Deleted

## 2012-11-13 VITALS — BP 140/77 | HR 50 | Temp 96.7°F | Resp 20 | Ht 73.0 in | Wt 186.9 lb

## 2012-11-13 DIAGNOSIS — C8299 Follicular lymphoma, unspecified, extranodal and solid organ sites: Secondary | ICD-10-CM

## 2012-11-13 DIAGNOSIS — C8589 Other specified types of non-Hodgkin lymphoma, extranodal and solid organ sites: Secondary | ICD-10-CM

## 2012-11-13 DIAGNOSIS — N400 Enlarged prostate without lower urinary tract symptoms: Secondary | ICD-10-CM

## 2012-11-13 NOTE — Progress Notes (Signed)
   [facility]    OFFICE PROGRESS NOTE   INTERVAL HISTORY:   He returns as scheduled. No new complaint. No evidence for recurrence of the scalp lesion. He denies fever, night sweats, and anorexia. No palpable lymph nodes.  Objective:  Vital signs in last 24 hours:  Blood pressure 140/77, pulse 50, temperature 96.7 F (35.9 C), temperature source Oral, resp. rate 20, height 6\' 1"  (1.854 m), weight 186 lb 14.4 oz (84.777 kg).    HEENT: No evidence of a lesion near the site of the scalp biopsy Lymphatics: No cervical, supraclavicular, or axillary nodes.? 1-2 cm left inguinal node. Resp: Lungs clear bilaterally Cardio: Regular rate and rhythm GI: No hepatosplenomegaly Vascular: No leg edema      Medications: I have reviewed the patient's current medications.  Assessment/Plan: 1. Low-grade follicular lymphoma, status post 4 weeks of rituximab therapy. The rituximab was last given on 04/30/2008. The palpable lymphadenopathy resolved following rituximab therapy. 2. Benign prostatic hypertrophy. 3. Admission with a lower gastrointestinal bleed, status post a colonoscopy revealing diverticulosis in August 2009. 4. History of mild neutropenia related to rituximab. 5. Removal of a left parietal scalp lesion in February 2013 with the pathology confirming non-Hodgkin's lymphoma-low-grade 2/3 follicular lymphoma  Disposition:  He remains in clinical remission from non-Hodgkin's lymphoma. The plan is to continue an observation approach. Mr. Chuba will return for an office visit in 6 months.   Thornton Papas, MD  11/13/2012  6:16 PM

## 2012-11-13 NOTE — Progress Notes (Signed)
Patient in to clinic today for routine scheduled visit. Met with patient briefly to explain that data collection for the research study will end by February 23, 2013 as originally scheduled. Patient is aware that he may receive another contact from the research nurse regarding his condition, for the final quarterly assessment. Thanked patient for his participation in the research study.

## 2012-11-14 ENCOUNTER — Telehealth: Payer: Self-pay | Admitting: Oncology

## 2012-11-14 NOTE — Telephone Encounter (Signed)
s.w. pt and gv appt schedule for June 2014.Marland KitchenMarland KitchenMarland KitchenMarland Kitchenpt aware   By Collier Salina

## 2013-02-12 ENCOUNTER — Telehealth: Payer: Self-pay | Admitting: *Deleted

## 2013-02-12 NOTE — Telephone Encounter (Signed)
Received return phone call and voice mail message from patient. Contacted patient via cell phone to confirm that he was aware of upcoming study closure as noted in the patient letter sent out this month. Patient states his health is well and he has no new hospitalizations, assessments or new cancers to report. Thanked patient again for his participation in the study. Patient inquired about his next appointment and was told that he is scheduled to see Dr. Truett Perna on Friday, June 20th at 10:30am.

## 2013-02-12 NOTE — Telephone Encounter (Signed)
Left messages at patient's home and on cell phone voice mail, requesting a final contact before the end of the month, to follow-up regarding patient status and end of [employer] Study follow-up, effective March 31st. Requested that patient return my call at [phone].

## 2013-05-15 ENCOUNTER — Ambulatory Visit (HOSPITAL_BASED_OUTPATIENT_CLINIC_OR_DEPARTMENT_OTHER): Payer: PRIVATE HEALTH INSURANCE | Admitting: Oncology

## 2013-05-15 ENCOUNTER — Telehealth: Payer: Self-pay | Admitting: Oncology

## 2013-05-15 ENCOUNTER — Ambulatory Visit: Payer: PRIVATE HEALTH INSURANCE | Admitting: Oncology

## 2013-05-15 VITALS — BP 142/80 | HR 44 | Temp 97.0°F | Resp 18 | Ht 73.0 in | Wt 190.0 lb

## 2013-05-15 DIAGNOSIS — C8589 Other specified types of non-Hodgkin lymphoma, extranodal and solid organ sites: Secondary | ICD-10-CM

## 2013-05-15 NOTE — Progress Notes (Signed)
   [facility]    OFFICE PROGRESS NOTE   INTERVAL HISTORY:   He returns as scheduled. He feels well. No complaint. No palpable lymph nodes. No scalp mass. No fever or night sweats. Good energy level.  Objective:  Vital signs in last 24 hours:  Blood pressure 142/80, pulse 44, temperature 97 F (36.1 C), temperature source Oral, resp. rate 18, height 6\' 1"  (1.854 m), weight 190 lb (86.183 kg).    HEENT: No palpable mass at the parietal scalp. Neck without mass, oropharynx without visible mass Lymphatics: No cervical, supraclavicular, axillary, or right inguinal nodes.? 1-2 cm left inguinal node Resp: Lungs clear bilaterally Cardio: Regular rate and rhythm GI: No hepatosplenomegaly Vascular: No leg edema   Medications: I have reviewed the patient's current medications.  Assessment/Plan: 1. Low-grade follicular lymphoma, status post 4 weeks of rituximab therapy. The rituximab was last given on 04/30/2008. The palpable lymphadenopathy resolved following rituximab therapy. 2. Benign prostatic hypertrophy. 3. Admission with a lower gastrointestinal bleed, status post a colonoscopy revealing diverticulosis in August 2009. 4. History of mild neutropenia related to rituximab. 5. Removal of a left parietal scalp lesion in February 2013 with the pathology confirming non-Hodgkin's lymphoma-low-grade 2/3 follicular lymphoma 6. Chronic bradycardia  Disposition:  He remains in clinical remission from non-Hodgkin's lymphoma. He will return for an office visit in 8 months. Mr. Towson will contact us in the interim for new symptoms.   Thornton Papas, MD  05/15/2013  5:41 PM

## 2013-05-15 NOTE — Telephone Encounter (Signed)
s.w. pt and advied in on Feb appt...pt ok and awaer

## 2014-01-15 ENCOUNTER — Telehealth: Payer: Self-pay | Admitting: Oncology

## 2014-01-15 ENCOUNTER — Ambulatory Visit (HOSPITAL_BASED_OUTPATIENT_CLINIC_OR_DEPARTMENT_OTHER): Payer: Managed Care, Other (non HMO) | Admitting: Oncology

## 2014-01-15 ENCOUNTER — Telehealth: Payer: Self-pay | Admitting: *Deleted

## 2014-01-15 VITALS — BP 152/85 | HR 50 | Temp 97.3°F | Resp 18 | Ht 73.0 in | Wt 194.3 lb

## 2014-01-15 DIAGNOSIS — I498 Other specified cardiac arrhythmias: Secondary | ICD-10-CM

## 2014-01-15 DIAGNOSIS — N400 Enlarged prostate without lower urinary tract symptoms: Secondary | ICD-10-CM

## 2014-01-15 DIAGNOSIS — C8299 Follicular lymphoma, unspecified, extranodal and solid organ sites: Secondary | ICD-10-CM

## 2014-01-15 DIAGNOSIS — C8589 Other specified types of non-Hodgkin lymphoma, extranodal and solid organ sites: Secondary | ICD-10-CM | POA: Insufficient documentation

## 2014-01-15 NOTE — Telephone Encounter (Signed)
lvm for pt regarding to Feb 2016 appt...mailed appt sched avs and letter

## 2014-01-15 NOTE — Telephone Encounter (Signed)
Called and informed patient last pneumococcal vaccine was 08/2011.  Per Dr. Benay Spice.  Patient verbalized understanding.

## 2014-01-15 NOTE — Progress Notes (Signed)
   [facility]    OFFICE PROGRESS NOTE   INTERVAL HISTORY:   He returns for scheduled followup of non-Hodgkin's lymphoma. He feels well. Good appetite and energy level. No palpable lymph nodes. The scalp nodule has not returned.  Objective:  Vital signs in last 24 hours:  Blood pressure 152/85, pulse 50, temperature 97.3 F (36.3 C), temperature source Oral, resp. rate 18, height 6\' 1"  (1.854 m), weight 194 lb 4.8 oz (88.134 kg), SpO2 99.00%.    HEENT: Neck without mass, no left scalp mass. Lymphatics: No cervical, supraclavicular, axillary, or right inguinal nodes. Slight fullness in the lateral left inguinal canal without a discrete lymph node. Resp: Lungs clear bilaterally Cardio: Regular rate and rhythm GI: No hepatosplenomegaly, nontender, no mass Vascular: No leg edema    Medications: I have reviewed the patient's current medications.  Assessment/Plan: 1. Low-grade follicular lymphoma, status post 4 weeks of rituximab therapy. The rituximab was last given on 04/30/2008. The palpable lymphadenopathy resolved following rituximab therapy. 2. Benign prostatic hypertrophy. 3. Admission with a lower gastrointestinal bleed, status post a colonoscopy revealing diverticulosis in August 2009. 4. History of mild neutropenia related to rituximab. 5. Removal of a left parietal scalp lesion in February 2013 with the pathology confirming non-Hodgkin's lymphoma-low-grade 2/3 follicular lymphoma 6. Chronic bradycardia  Disposition:  He remains in clinical remission from non-Hodgkins lymphoma. He will return for an office visit in one year. Derek Brown will contact us in the interim for new symptoms or palpable lymphadenopathy. He will stay up-to-date on the influenza and pneumococcal vaccines. He last had a pneumococcal vaccine in October of 2012.   Derek Coder, MD  01/15/2014  4:00 PM

## 2014-03-17 ENCOUNTER — Other Ambulatory Visit: Payer: Self-pay | Admitting: Gastroenterology

## 2014-07-26 DIAGNOSIS — N401 Enlarged prostate with lower urinary tract symptoms: Secondary | ICD-10-CM | POA: Insufficient documentation

## 2014-07-26 DIAGNOSIS — I861 Scrotal varices: Secondary | ICD-10-CM | POA: Insufficient documentation

## 2014-07-26 DIAGNOSIS — K4090 Unilateral inguinal hernia, without obstruction or gangrene, not specified as recurrent: Secondary | ICD-10-CM | POA: Insufficient documentation

## 2014-07-26 HISTORY — DX: Unilateral inguinal hernia, without obstruction or gangrene, not specified as recurrent: K40.90

## 2014-10-08 ENCOUNTER — Telehealth: Payer: Self-pay | Admitting: Oncology

## 2014-10-08 NOTE — Telephone Encounter (Signed)
Lvm advising appt chg from 2/19 ([facility]) to 2/22 @ 8.30am. Also mailed revised appt calendar.

## 2014-12-29 ENCOUNTER — Telehealth: Payer: Self-pay | Admitting: Oncology

## 2014-12-29 NOTE — Telephone Encounter (Signed)
S/w pt confirming MD visit r/s for 02/22 from 02/19.... KJ

## 2015-01-14 ENCOUNTER — Ambulatory Visit: Payer: Managed Care, Other (non HMO) | Admitting: Oncology

## 2015-01-17 ENCOUNTER — Telehealth: Payer: Self-pay | Admitting: Oncology

## 2015-01-17 ENCOUNTER — Ambulatory Visit (HOSPITAL_BASED_OUTPATIENT_CLINIC_OR_DEPARTMENT_OTHER): Payer: BLUE CROSS/BLUE SHIELD | Admitting: Oncology

## 2015-01-17 VITALS — BP 144/91 | HR 46 | Temp 98.1°F | Resp 20 | Ht 73.0 in | Wt 186.6 lb

## 2015-01-17 DIAGNOSIS — C8590 Non-Hodgkin lymphoma, unspecified, unspecified site: Secondary | ICD-10-CM

## 2015-01-17 NOTE — Telephone Encounter (Signed)
Pt confirmed MD visit per 02/22 POF, gave pt AVS... KJ  °

## 2015-01-17 NOTE — Progress Notes (Signed)
  [facility] OFFICE PROGRESS NOTE   Diagnosis: Non-Hodgkin's lymphoma  INTERVAL HISTORY:   He returns as scheduled. He feels well. No fever, night sweats, or palpable lymph nodes. He noted a tiny lesion at the right upper neck last year that he thinks may be a "pimple ".  Objective:  Vital signs in last 24 hours:  Blood pressure 144/91, pulse 46, temperature 98.1 F (36.7 C), temperature source Oral, resp. rate 20, height 6\' 1"  (1.854 m), weight 186 lb 9.6 oz (84.641 kg), SpO2 100 %. repeat manual blood pressure 152/82    HEENT: Neck without mass Lymphatics: No cervical, supraclavicular, axillary, or inguinal nodes, slight fullness in the left inguinal region without a discrete lymph node Resp: Lungs clear bilaterally Cardio: Regular rate and rhythm GI: No hepatosplenomegaly Vascular: No leg edema  Skin: One to 2 mm pimple-type skin lesion at the right upper neck near the mandible, no mass . Left parietal scalp without a palpable mass.   Medications: I have reviewed the patient's current medications.  Assessment/Plan: 1. Low-grade follicular lymphoma, status post 4 weeks of rituximab therapy. The rituximab was last given on 04/30/2008. The palpable lymphadenopathy resolved following rituximab therapy. 2. Benign prostatic hypertrophy. 3. Admission with a lower gastrointestinal bleed, status post a colonoscopy revealing diverticulosis in August 2009. 4. History of mild neutropenia related to rituximab. 5. Removal of a left parietal scalp lesion in February 2013 with the pathology confirming non-Hodgkin's lymphoma-low-grade 2/3 follicular lymphoma 6. Chronic bradycardia  Disposition:  Derek Brown remains in clinical remission from non-Hodgkin's lymphoma. He will return for an office visit in one year. He will contact us in the interim for new symptoms or palpable lymph nodes. I recommended he obtain the 13 valent pneumococcal vaccine from his primary  physician.  Betsy Coder, MD  01/17/2015  9:25 AM

## 2016-01-19 ENCOUNTER — Ambulatory Visit: Payer: BLUE CROSS/BLUE SHIELD | Admitting: Oncology

## 2016-01-20 ENCOUNTER — Other Ambulatory Visit: Payer: Self-pay | Admitting: *Deleted

## 2016-01-20 ENCOUNTER — Telehealth: Payer: Self-pay | Admitting: Oncology

## 2016-01-20 NOTE — Telephone Encounter (Signed)
S/w pt confirming MD visit per 02/24 POF.... Cherylann Banas

## 2016-01-23 ENCOUNTER — Ambulatory Visit (HOSPITAL_BASED_OUTPATIENT_CLINIC_OR_DEPARTMENT_OTHER): Payer: 59 | Admitting: Oncology

## 2016-01-23 VITALS — BP 162/82 | HR 48 | Temp 97.8°F | Resp 17 | Ht 73.0 in | Wt 186.3 lb

## 2016-01-23 DIAGNOSIS — C8208 Follicular lymphoma grade I, lymph nodes of multiple sites: Secondary | ICD-10-CM

## 2016-01-23 NOTE — Progress Notes (Signed)
  [facility] OFFICE PROGRESS NOTE   Diagnosis: Non-Hodgkin's lymphoma  INTERVAL HISTORY:   Mr.Derek Brown returns for a scheduled visit. He feels well. No palpable lymph nodes. No fever, night sweats, or anorexia. He reports recently receiving influenza and pneumococcal vaccines. He had a right inguinal hernia repair in the past. He has noted increased fullness in this area.  Objective:  Vital signs in last 24 hours:  Blood pressure 162/82, pulse 48, temperature 97.8 F (36.6 C), temperature source Oral, resp. rate 17, height 6\' 1"  (1.854 m), weight 186 lb 4.8 oz (84.505 kg), SpO2 99 %.    HEENT:  neck without mass, parietal scalp without mass Lymphatics:  no cervical, supraclavicular, axillary, or inguinal nodes Resp:  lungs clear bilaterally Cardio:  regular rate and rhythm GI:  no hepatomegaly, no mass, nontender, slight soft fullness in the right inguinal area without a mass or lymph node. GU: Testes without mass Vascular:  no leg edema    Medications: I have reviewed the patient's current medications.  Assessment/Plan: 1. Low-grade follicular lymphoma diagnosed on biopsy of a right submandibular lymph node and left intraparotid node December 2005. Treated with rituximab, last given on 04/30/2008. The palpable lymphadenopathy resolved following rituximab therapy. 2. Benign prostatic hypertrophy. 3. Admission with a lower gastrointestinal bleed, status post a colonoscopy revealing diverticulosis in August 2009. 4. History of mild neutropenia related to rituximab. 5. Removal of a left parietal scalp lesion in February 2013 with the pathology confirming non-Hodgkin's lymphoma-low-grade 2/3 follicular lymphoma 6. Chronic bradycardia    Disposition:   Mr. [name] remains in clinical remission from non-Hodgkin's lymphoma. He will ask his urologist to evaluate the right inguinal area. He may have recurrence of a hernia.  He will return for an office visit in  one-year. He will contact us in the interim for new symptoms.  Betsy Coder, MD  01/23/2016  9:19 AM

## 2016-01-24 ENCOUNTER — Telehealth: Payer: Self-pay | Admitting: Oncology

## 2016-01-24 NOTE — Telephone Encounter (Signed)
cld pt and left amessage of appt for 12/2016 @ 11:30

## 2016-07-23 DIAGNOSIS — R972 Elevated prostate specific antigen [PSA]: Secondary | ICD-10-CM | POA: Insufficient documentation

## 2016-09-20 ENCOUNTER — Encounter (HOSPITAL_COMMUNITY): Payer: Self-pay

## 2016-09-20 ENCOUNTER — Emergency Department (HOSPITAL_COMMUNITY)
Admission: EM | Admit: 2016-09-20 | Discharge: 2016-09-20 | Disposition: A | Discharge status: Home / Self Care | Payer: 59 | Attending: Emergency Medicine | Admitting: Emergency Medicine

## 2016-09-20 ENCOUNTER — Emergency Department (HOSPITAL_COMMUNITY): Payer: 59

## 2016-09-20 DIAGNOSIS — S065XAA Traumatic subdural hemorrhage with loss of consciousness status unknown, initial encounter: Secondary | ICD-10-CM

## 2016-09-20 DIAGNOSIS — Z8589 Personal history of malignant neoplasm of other organs and systems: Secondary | ICD-10-CM | POA: Diagnosis not present

## 2016-09-20 DIAGNOSIS — S065X9A Traumatic subdural hemorrhage with loss of consciousness of unspecified duration, initial encounter: Secondary | ICD-10-CM

## 2016-09-20 DIAGNOSIS — Z7982 Long term (current) use of aspirin: Secondary | ICD-10-CM | POA: Diagnosis not present

## 2016-09-20 DIAGNOSIS — I6200 Nontraumatic subdural hemorrhage, unspecified: Secondary | ICD-10-CM | POA: Diagnosis not present

## 2016-09-20 DIAGNOSIS — R200 Anesthesia of skin: Secondary | ICD-10-CM | POA: Diagnosis present

## 2016-09-20 HISTORY — DX: Malignant (primary) neoplasm, unspecified: C80.1

## 2016-09-20 LAB — CBC
HCT: 43.4 % (ref 39.0–52.0)
Hemoglobin: 14.4 g/dL (ref 13.0–17.0)
MCH: 31.4 pg (ref 26.0–34.0)
MCHC: 33.2 g/dL (ref 30.0–36.0)
MCV: 94.8 fL (ref 78.0–100.0)
PLATELETS: 169 10*3/uL (ref 150–400)
RBC: 4.58 MIL/uL (ref 4.22–5.81)
RDW: 13.4 % (ref 11.5–15.5)
WBC: 5.1 10*3/uL (ref 4.0–10.5)

## 2016-09-20 LAB — DIFFERENTIAL
BASOS PCT: 0 %
Basophils Absolute: 0 10*3/uL (ref 0.0–0.1)
EOS PCT: 2 %
Eosinophils Absolute: 0.1 10*3/uL (ref 0.0–0.7)
Lymphocytes Relative: 23 %
Lymphs Abs: 1.2 10*3/uL (ref 0.7–4.0)
MONO ABS: 0.4 10*3/uL (ref 0.1–1.0)
Monocytes Relative: 8 %
Neutro Abs: 3.4 10*3/uL (ref 1.7–7.7)
Neutrophils Relative %: 67 %

## 2016-09-20 LAB — COMPREHENSIVE METABOLIC PANEL
ALT: 18 U/L (ref 17–63)
ANION GAP: 7 (ref 5–15)
AST: 21 U/L (ref 15–41)
Albumin: 4.3 g/dL (ref 3.5–5.0)
Alkaline Phosphatase: 45 U/L (ref 38–126)
BUN: 11 mg/dL (ref 6–20)
CHLORIDE: 104 mmol/L (ref 101–111)
CO2: 27 mmol/L (ref 22–32)
Calcium: 9.4 mg/dL (ref 8.9–10.3)
Creatinine, Ser: 0.6 mg/dL — ABNORMAL LOW (ref 0.61–1.24)
GFR calc non Af Amer: >60 mL/min (ref >60)
Glucose, Bld: 114 mg/dL — ABNORMAL HIGH (ref 65–99)
Potassium: 3.9 mmol/L (ref 3.5–5.1)
SODIUM: 138 mmol/L (ref 135–145)
Total Bilirubin: 0.7 mg/dL (ref 0.3–1.2)
Total Protein: 7.1 g/dL (ref 6.5–8.1)

## 2016-09-20 LAB — PROTIME-INR
INR: 1.09
PROTHROMBIN TIME: 14.1 s (ref 11.4–15.2)

## 2016-09-20 LAB — APTT: aPTT: 37 seconds — ABNORMAL HIGH (ref 24–36)

## 2016-09-20 LAB — I-STAT TROPONIN, ED: Troponin i, poc: 0 ng/mL (ref 0.00–0.08)

## 2016-09-20 MED ORDER — LEVETIRACETAM 500 MG PO TABS: 500.0000 mg | ORAL_TABLET | Freq: Two times a day (BID) | ORAL | 0 refills | Status: DC

## 2016-09-20 MED ORDER — LEVETIRACETAM 500 MG PO TABS
500.0000 mg | ORAL_TABLET | Freq: Once | ORAL | Status: AC
  Administered 2016-09-20: 500 mg via ORAL
  Filled 2016-09-20: qty 1

## 2016-09-20 NOTE — ED Notes (Signed)
Pt states  He understands instructions. Home stable with wife and with steady gait.

## 2016-09-20 NOTE — ED Triage Notes (Signed)
Patient here with 4 days of intermittent visual changes, arm numbness and today trouble speaking. States that he feels as if he is having a hard time making his tongue work. No pain, no headache, no gait disturbance. Alert and oriented

## 2016-09-20 NOTE — ED Notes (Signed)
Pt returns from [facility].

## 2016-09-20 NOTE — ED Notes (Signed)
Pt transported to CT ?

## 2016-09-20 NOTE — Discharge Instructions (Signed)
Follow up with Dr. Diamantina Monks in 1 week for re-evaluation. Take Keppra as prescribed. Return to the ED immediately if you experience severe worsening of your symptoms, severe headache, chest pain, seizure like activity or change in your neurological status.

## 2016-09-20 NOTE — ED Notes (Signed)
Pt returns from radiology. 

## 2016-09-20 NOTE — ED Provider Notes (Signed)
[facility] DEPT Provider Note   CSN: [id number] Arrival date & time: 09/20/16  1145     History   Chief Complaint Chief Complaint  Patient presents with  . numbness intermittent 4 days    HPI Derek Brown is a 60 y.o. male with pmhx of lymphoma, in remission who presents to the ED today c/o tingling/numbness on the left side of his body. Pt states that 4 days ago he noticed a numb sensation in his left hand. He states that it began as a tingling sensation and then progressed to where he could not feel what he was touching in his hand. This sensation was intermittent since then and he began experiencing this in his left leg, foot, left flank and the left side of his face. Pt also reports a change in his vision out of his left eye statin that "it look like I'm looking through bevelled glass". Pt went to UC yesterday and states that they did a bedside carotid ultrasound that was negative and discharged him to home. This morning when he woke up he tried to answer the phone but was unable to get the words out. Pt states that "I knew what I wanted to say but I couldn't say them". He went to he PCP and was sent here with concern for stroke. Pt does not take any blood thinners. Denies CP, SOB, syncope.  HPI  Past Medical History:  Diagnosis Date  . Cancer [facility])     Patient Active Problem List   Diagnosis Date Noted  . Other malignant lymphomas, unspecified site, extranodal and solid organ sites 01/15/2014    History reviewed. No pertinent surgical history.     Home Medications    Prior to Admission medications   Medication Sig Start Date End Date Taking? Authorizing Provider  aspirin EC 81 MG tablet Take 81 mg by mouth daily.   Yes Historical Provider, MD  fexofenadine (ALLEGRA) 180 MG tablet Take 180 mg by mouth as needed.   Yes Historical Provider, MD  vitamin C (ASCORBIC ACID) 500 MG tablet Take 500 mg by mouth daily.   Yes Historical Provider, MD    Family History No  family history on file.  Social History Social History  Substance Use Topics  . Smoking status: Never Smoker  . Smokeless tobacco: Never Used  . Alcohol use No     Allergies   Review of patient's allergies indicates no known allergies.   Review of Systems Review of Systems  All other systems reviewed and are negative.    Physical Exam Updated Vital Signs BP 139/77   Pulse (!) 53   Temp 97.8 F (36.6 C)   Resp 18   Ht 6\' 1"  (1.854 m)   Wt 85.7 kg   SpO2 97%   BMI 24.94 kg/m   Physical Exam  Constitutional: He is oriented to person, place, and time. He appears well-developed and well-nourished. No distress.  HENT:  Head: Normocephalic and atraumatic.  Nose: Nose normal.  Mouth/Throat: No oropharyngeal exudate.  Eyes: Conjunctivae and EOM are normal. Pupils are equal, round, and reactive to light. Right eye exhibits no discharge. Left eye exhibits no discharge. No scleral icterus.  Cardiovascular: Normal rate, regular rhythm, normal heart sounds and intact distal pulses.  Exam reveals no gallop and no friction rub.   No murmur heard. Pulmonary/Chest: Effort normal and breath sounds normal. No respiratory distress. He has no wheezes. He has no rales. He exhibits no tenderness.  Abdominal: Soft. He  exhibits no distension. There is no tenderness. There is no guarding.  Musculoskeletal: Normal range of motion. He exhibits no edema.  Neurological: He is alert and oriented to person, place, and time. He has normal reflexes. No cranial nerve deficit.  Strength 5/5 throughout. Pt reports decreased sensation in left hand vs right to dullness. Normal sensation to sharp. Good grip strength b/l. No gait abnormality. No dysmetria. No slurred speech. No facial droop. Negative pronator drift.    Skin: Skin is warm and dry. No rash noted. He is not diaphoretic. No erythema. No pallor.  Psychiatric: He has a normal mood and affect. His behavior is normal.  Nursing note and vitals  reviewed.    ED Treatments / Results  Labs (all labs ordered are listed, but only abnormal results are displayed) Labs Reviewed  APTT - Abnormal; Notable for the following:       Result Value   aPTT 37 (*)    All other components within normal limits  COMPREHENSIVE METABOLIC PANEL - Abnormal; Notable for the following:    Glucose, Bld 114 (*)    Creatinine, Ser 0.60 (*)    All other components within normal limits  PROTIME-INR  CBC  DIFFERENTIAL  I-STAT TROPOININ, ED    EKG  EKG Interpretation None       Radiology Ct Head Wo Contrast  Result Date: 09/20/2016 CLINICAL DATA:  Slurred speech, weakness for 4 days EXAM: CT HEAD WITHOUT CONTRAST TECHNIQUE: Contiguous axial images were obtained from the base of the skull through the vertex without intravenous contrast. COMPARISON:  None available FINDINGS: Brain: Bilateral mixed density subdural hematomas noted over the cerebral hemispheres, roughly measuring 15 mm in thickness on both sides. The subdural hematomas appear acute on chronic. Mild mass-effect over the cerebral convexities without midline shift, herniation, or hydrocephalus. No interventricular or subarachnoid hemorrhage appreciated. Ventricles are symmetric. Basilar cisterns are patent. No acute infarction or mass lesion. Vascular: No hyperdense vessel or unexpected calcification. Skull: Normal. Negative for fracture or focal lesion. Sinuses/Orbits: No acute finding. Other: None. IMPRESSION: Bilateral mixed density subdural hematomas over the cerebral hemispheres appearing acute on chronic. Measurements as above. These results were called by telephone at the time of interpretation on 09/20/2016 at 1:04 pm to Dr. Merrily Pew , who verbally acknowledged these results. Electronically Signed   By: Jerilynn Mages.  Shick M.D.   On: 09/20/2016 13:05    Procedures Procedures (including critical care time)  Medications Ordered in ED Medications  levETIRAcetam (KEPPRA) tablet 500 mg (not  administered)     Initial Impression / Assessment and Plan / ED Course  I have reviewed the triage vital signs and the nursing notes.  Pertinent labs & imaging results that were available during my care of the patient were reviewed by me and considered in my medical decision making (see chart for details).  Clinical Course   Otherwise healthy 60 y.o m presents to the ED today with c/o intermittent numbness in left hand/leg and face. Today also reports brief episode of expressive aphasia. Sent by PCP with concern for stroke. On presentation to ED, pt appears well. No crania nerve deficit on exam. Pt does report some decreased sensation in left hand vs right with dull touch. Otherwise no neuro deficits. Strength 5/5 throughout. Lab work unremarkable. CT reveals bilateral mixed density subdural hematomas over the cerebral hemispheres appearing acute on chronic. Pt states that he hit his head several times while at work when he was in his 20's-30's but denies any  recent falls or head injury. Unclear etiology of CT findings. Pt is not anticoagulated.  Spoke with Dr. Diamantina Monks with neuro surgery who reviewed pts images and states that pt may also be experiencing [place] seizures. Pt placed on Keppra 500mg  BID. Dr. Diamantina Monks feels that pt may be safe for discharge with follow up in the clinic with him in 1 week. They will likely repeat CT imaging at that time to follow the subdural bleeds.   I have discussed treatment plan with pt and wife who are agreeable. I have given VERY STRICT return precautions, discussed thoroughly. Pt appears very reliable and wil return to the ED immediately if he has new or worsening symptoms.   Case discussed with Dr. Dayna Barker who agrees with treatment plan.  Final Clinical Impressions(s) / ED Diagnoses   Final diagnoses:  Subdural hematoma [facility])    New Prescriptions New Prescriptions   No medications on file     Carlos Levering, PA-C 09/21/16  [id number]    Merrily Pew, MD 09/21/16 [phone]

## 2016-11-30 ENCOUNTER — Other Ambulatory Visit: Payer: Self-pay | Admitting: Neurosurgery

## 2016-11-30 DIAGNOSIS — I6200 Nontraumatic subdural hemorrhage, unspecified: Secondary | ICD-10-CM

## 2016-12-04 ENCOUNTER — Ambulatory Visit
Admission: RE | Admit: 2016-12-04 | Discharge: 2016-12-04 | Disposition: A | Discharge status: Home / Self Care | Payer: 59 | Source: Ambulatory Visit | Attending: Neurosurgery | Admitting: Neurosurgery

## 2016-12-04 DIAGNOSIS — I6200 Nontraumatic subdural hemorrhage, unspecified: Secondary | ICD-10-CM

## 2016-12-26 DIAGNOSIS — Z8572 Personal history of non-Hodgkin lymphomas: Secondary | ICD-10-CM | POA: Diagnosis not present

## 2016-12-26 DIAGNOSIS — Z1159 Encounter for screening for other viral diseases: Secondary | ICD-10-CM | POA: Diagnosis not present

## 2016-12-26 DIAGNOSIS — R001 Bradycardia, unspecified: Secondary | ICD-10-CM | POA: Insufficient documentation

## 2016-12-26 DIAGNOSIS — Z23 Encounter for immunization: Secondary | ICD-10-CM | POA: Diagnosis not present

## 2016-12-26 DIAGNOSIS — Z0000 Encounter for general adult medical examination without abnormal findings: Secondary | ICD-10-CM | POA: Diagnosis not present

## 2016-12-26 DIAGNOSIS — S065X1D Traumatic subdural hemorrhage with loss of consciousness of 30 minutes or less, subsequent encounter: Secondary | ICD-10-CM | POA: Diagnosis not present

## 2017-01-22 ENCOUNTER — Ambulatory Visit (HOSPITAL_BASED_OUTPATIENT_CLINIC_OR_DEPARTMENT_OTHER): Payer: 59 | Admitting: Oncology

## 2017-01-22 VITALS — BP 142/85 | HR 52 | Temp 99.0°F | Resp 17 | Wt 192.7 lb

## 2017-01-22 DIAGNOSIS — R001 Bradycardia, unspecified: Secondary | ICD-10-CM

## 2017-01-22 DIAGNOSIS — C8208 Follicular lymphoma grade I, lymph nodes of multiple sites: Secondary | ICD-10-CM
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
## 2017-02-06 ENCOUNTER — Telehealth: Payer: Self-pay | Admitting: Oncology

## 2017-02-06 NOTE — Telephone Encounter (Signed)
Spoke with patient and confirmed appointment for Feb 2019.  " I got an email and saw it on [place] but thank you for calling"

## 2017-07-13 IMAGING — CT CT HEAD W/O CM
3 of 4 series · 16 of 47 positions shown, 19 images · non-contrast
Comparison: MRI brain 10/10/2016.  CT of the head 09/20/2016

CLINICAL DATA: Subdural hematoma. Concussion June 2016. Remote
history of lymphoma treated with chemotherapy.

EXAM:
CT HEAD WITHOUT CONTRAST
TECHNIQUE: Contiguous axial images were obtained from the base of the skull
through the vertex without intravenous contrast.

[Series 32: 3d filtered head w/o · axial · non-contrast · 0.49mm/px · z∈[-48,+102]mm · 10 of 36 slices shown, 13 images]
[im 3/36  brain]
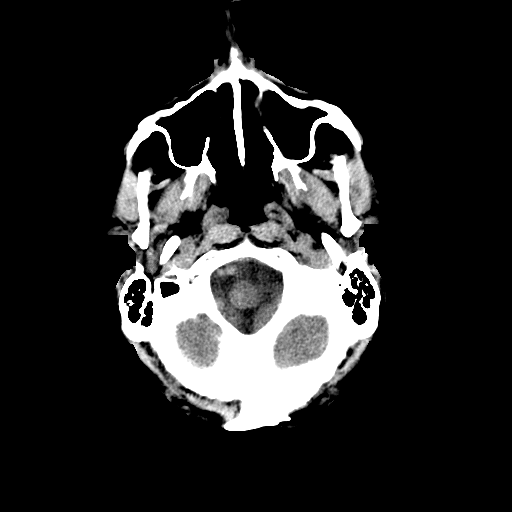
[im 3/36  bone]
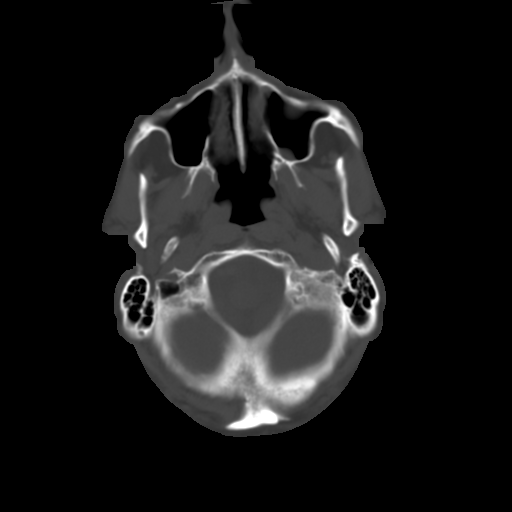
[im 6/36  brain]
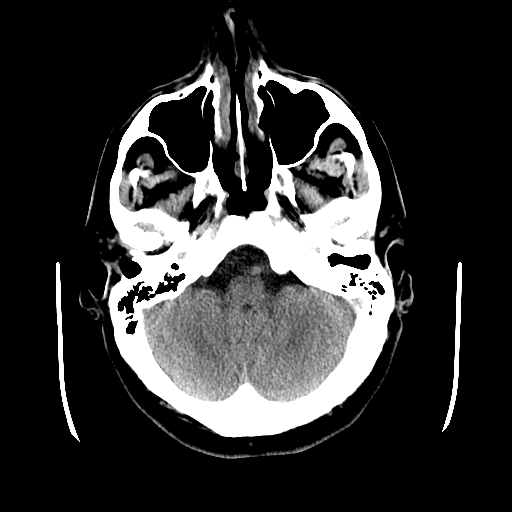
[im 11/36  brain]
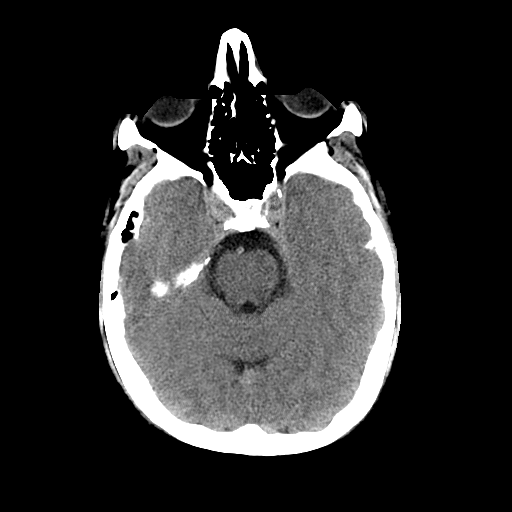
[im 13/36  brain]
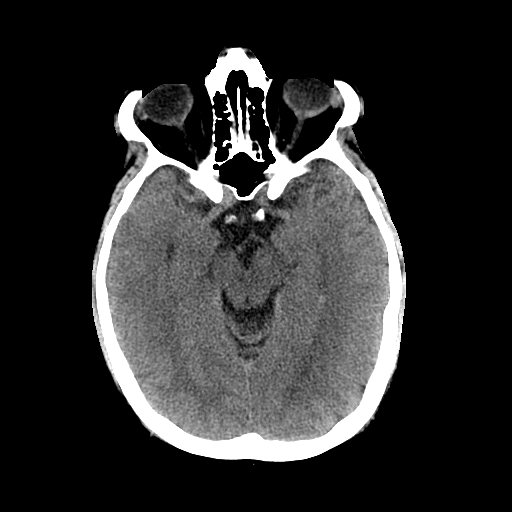
[im 16/36  brain]
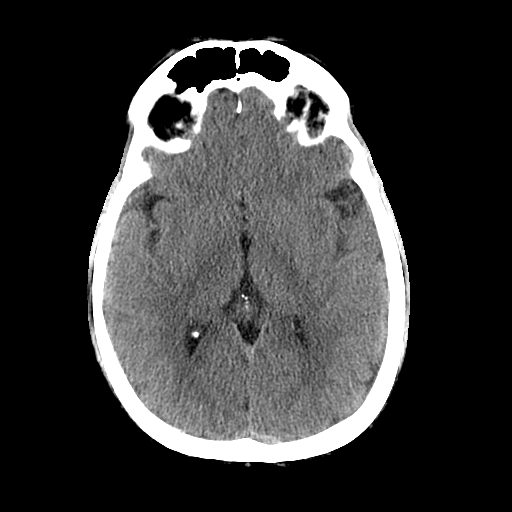
[im 16/36  bone]
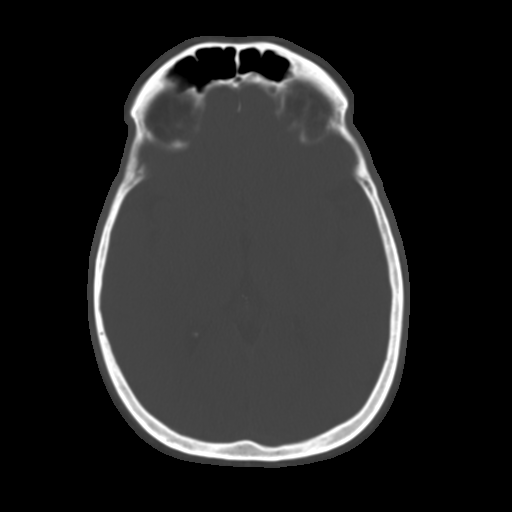
[im 21/36  brain]
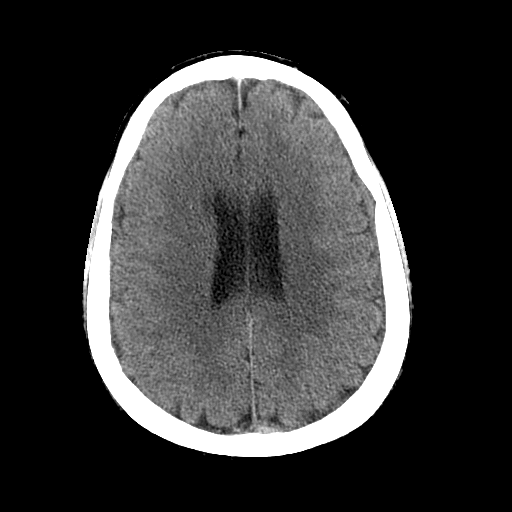
[im 23/36  brain]
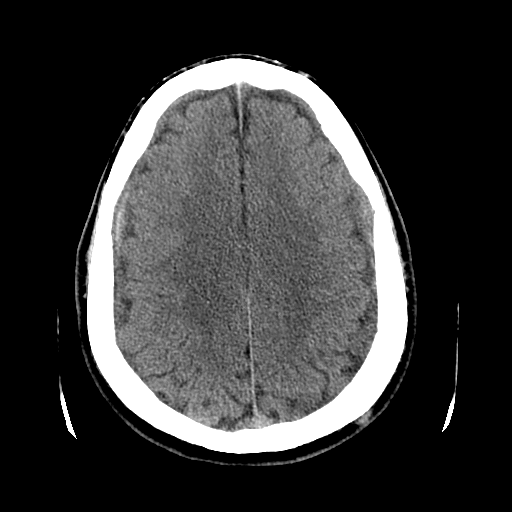
[im 26/36  brain]
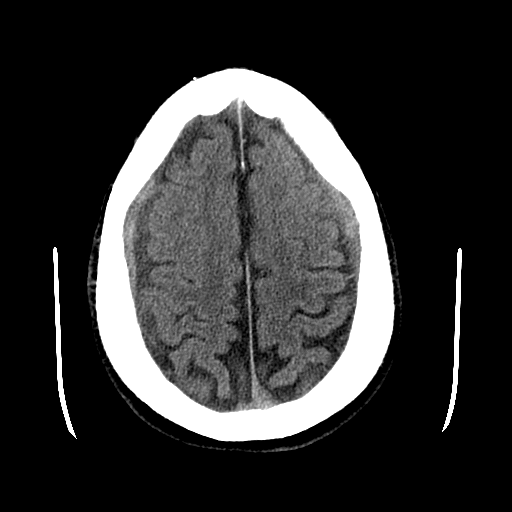
[im 31/36  brain]
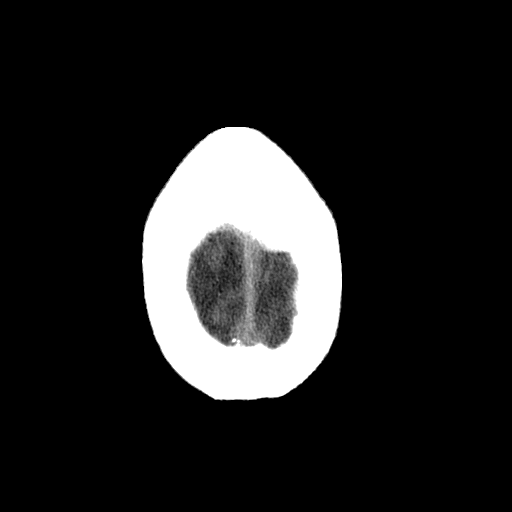
[im 31/36  bone]
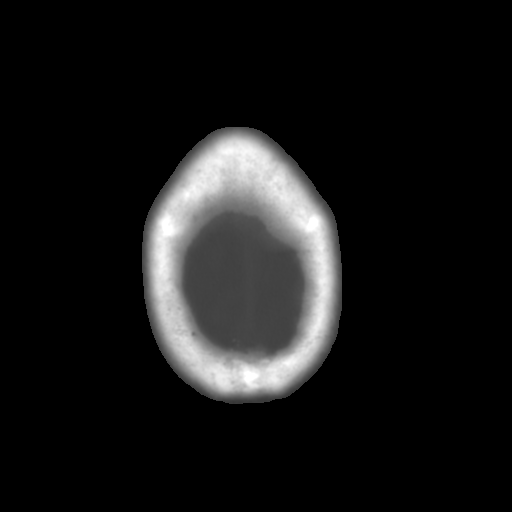
[im 33/36  brain]
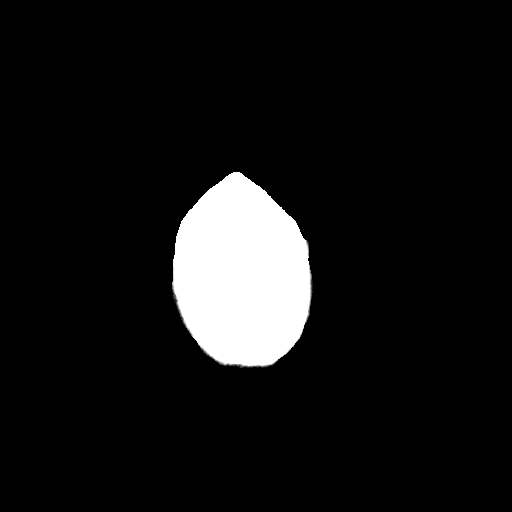

[Series 601: coronal brain · coronal · 0.49mm/px · 3 of 79 slices shown]
[im 27/79  brain]
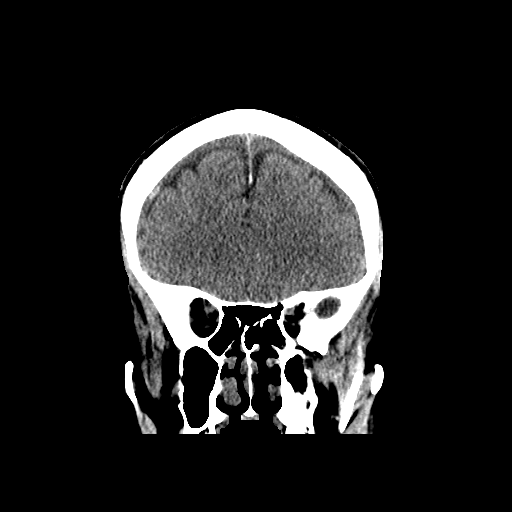
[im 35/79  brain]
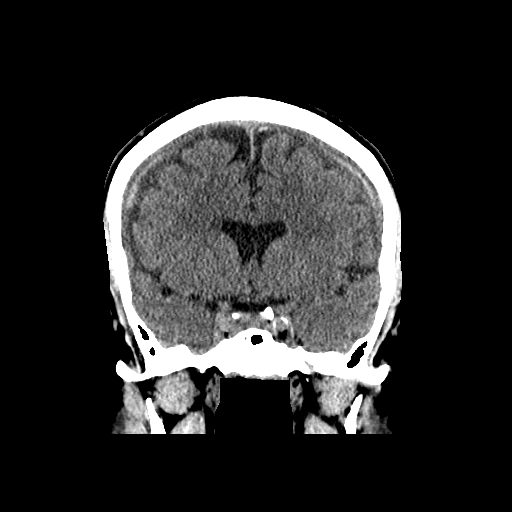
[im 44/79  brain]
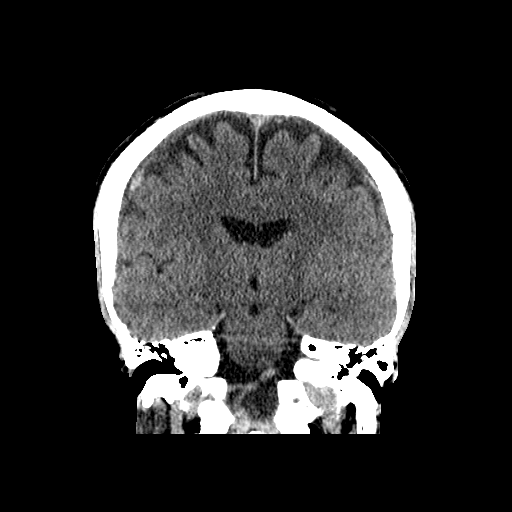

[Series 602: sagittal brain · sagittal · 0.49mm/px · 3 of 60 slices shown]
[im 20/60  brain]
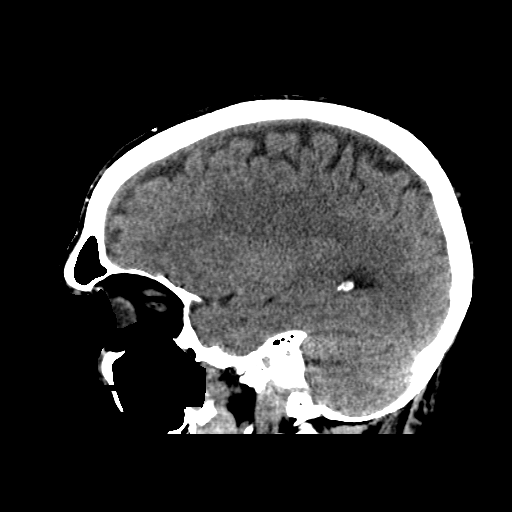
[im 30/60  brain]
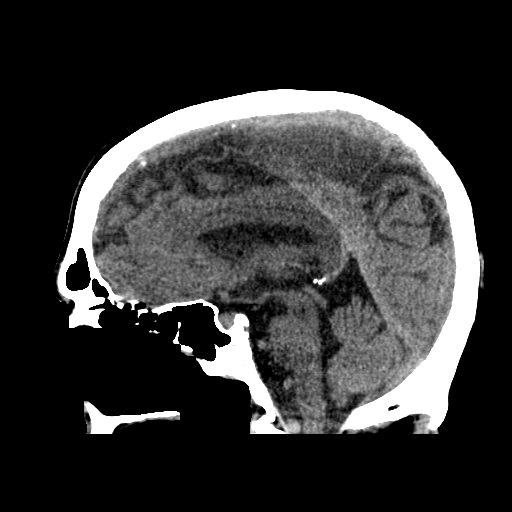
[im 40/60  brain]
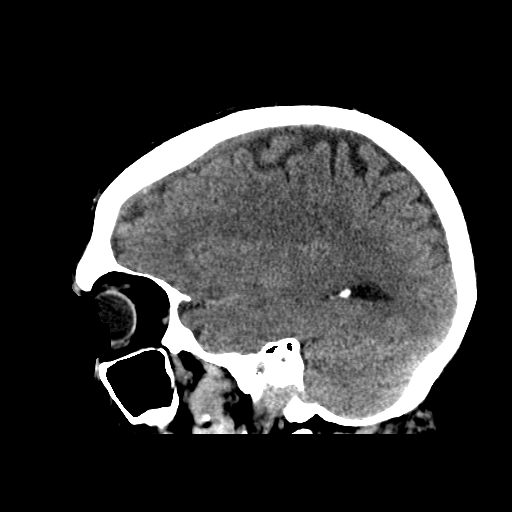

[16 of 47 positions shown; findings below may reference images not displayed]

FINDINGS: Brain: Mixed density extra-axial fluid collections are again seen.
The compared with the prior CT scan, there is significantly more
hypodense component. The maximal coronal measurements are similar to
the prior MRI, 10.5 mm on the right and 9.0 mm on the left. The
collections may be slightly improved more posteriorly. There is no
focal increase in the collections.

There is partial effacement of the sulci without midline shift. The
ventricles are of normal size. No acute infarct is present. No acute
parenchymal hemorrhage is evident.

Vascular: Minimal calcifications are present in the left cavernous
internal carotid artery. There is no hyperdense vessel.

Skull: The calvarium is intact. No focal lytic or blastic lesions
are present.

Sinuses/Orbits: Minimal mucosal thickening is evident along the
inferior aspect of the left maxillary sinus. The remaining paranasal
sinuses and the mastoid air cells are clear.
IMPRESSION: 1. Similar appearance of mixed density extra-axial fluid
collections. There is no definite new hemorrhage. Some of the
hyperdensity may be related to calcification or septation of the
subdural collections.
2. Mild effacement of the sulci reflecting mass effect without
midline shift. This has not changed significantly.
3. Minimal left maxillary sinus disease.

## 2017-08-16 DIAGNOSIS — R972 Elevated prostate specific antigen [PSA]: Secondary | ICD-10-CM | POA: Diagnosis not present

## 2017-08-16 DIAGNOSIS — N401 Enlarged prostate with lower urinary tract symptoms: Secondary | ICD-10-CM | POA: Diagnosis not present

## 2017-09-05 DIAGNOSIS — Z23 Encounter for immunization: Secondary | ICD-10-CM | POA: Diagnosis not present

## 2017-12-02 DIAGNOSIS — C8590 Non-Hodgkin lymphoma, unspecified, unspecified site: Secondary | ICD-10-CM | POA: Diagnosis not present

## 2017-12-02 DIAGNOSIS — D229 Melanocytic nevi, unspecified: Secondary | ICD-10-CM | POA: Diagnosis not present

## 2018-01-06 ENCOUNTER — Telehealth: Payer: Self-pay | Admitting: Oncology

## 2018-01-06 NOTE — Telephone Encounter (Signed)
FAXED RECORDS TO DR Ruffin ID [id number]

## 2018-01-21 ENCOUNTER — Telehealth: Payer: Self-pay | Admitting: Oncology

## 2018-01-21 ENCOUNTER — Inpatient Hospital Stay: Payer: 59 | Attending: Oncology | Admitting: Oncology

## 2018-01-21 VITALS — BP 150/73 | HR 51 | Temp 97.7°F | Resp 18 | Ht 73.0 in | Wt 189.8 lb

## 2018-01-21 DIAGNOSIS — C8208 Follicular lymphoma grade I, lymph nodes of multiple sites: Secondary | ICD-10-CM

## 2018-01-21 DIAGNOSIS — C8590 Non-Hodgkin lymphoma, unspecified, unspecified site: Secondary | ICD-10-CM | POA: Insufficient documentation

## 2018-01-21 DIAGNOSIS — R001 Bradycardia, unspecified: Secondary | ICD-10-CM | POA: Diagnosis not present

## 2018-01-21 NOTE — Progress Notes (Signed)
  [place] OFFICE PROGRESS NOTE   Diagnosis: Non-Hodgkin's lymphoma  INTERVAL HISTORY:   Derek Brown returns for a scheduled visit.  He feels well.  No palpable lymph nodes.  Good appetite and energy level.  No complaint.  Objective:  Vital signs in last 24 hours:  Blood pressure (!) 150/73, pulse (!) 51, temperature 97.7 F (36.5 C), temperature source Oral, resp. rate 18, height 6\' 1"  (1.854 m), weight 189 lb 12.8 oz (86.1 kg), SpO2 99 %.  Repeat manual blood pressure 142/60    HEENT: Neck without mass Lymphatics: No cervical, supraclavicular, submental, axillary, or inguinal nodes Resp: Lungs clear bilaterally Cardio: Regular rate and rhythm GI: No hepatosplenomegaly, no mass, nontender Vascular: No leg edema  Skin no apparent skin lesion at the left parietal scalp   Medications: I have reviewed the patient's current medications.   Assessment/Plan: 1. Low-grade follicular lymphoma diagnosed on biopsy of a right submandibular lymph node and left intraparotid node December 2005. Treated with rituximab, last given on 04/30/2008. The palpable lymphadenopathy resolved following rituximab therapy. 2. Benign prostatic hypertrophy. 3. Admission with a lower gastrointestinal bleed, status post a colonoscopy revealing diverticulosis in August 2009. 4. History of mild neutropenia related to rituximab. 5. Removal of a left parietal scalp lesion in February 2013 with the pathology confirming non-Hodgkin's lymphoma-low-grade 2/3 follicular lymphoma 6. Chronic bradycardia 7. Subdural hematomas fall 2017-likely related to a fall  Disposition: Derek Brown remains in clinical remission from non-Hodgkin's lymphoma.  He would like to continue follow-up at the Cancer center.  He will return for an office visit in 1 year.  He will contact us in the interim for new symptoms.  15 minutes were spent with the patient today.  The majority of the time was used for counseling and  coordination of care.  Betsy Coder, MD  01/21/2018  11:08 AM

## 2018-01-21 NOTE — Telephone Encounter (Signed)
Appointments scheduled [facility]/[place] printed.  Patient request that his PCP be changed also.  Per 2/26 los

## 2018-01-23 ENCOUNTER — Ambulatory Visit: Payer: 59 | Admitting: Oncology

## 2018-01-31 DIAGNOSIS — Z0000 Encounter for general adult medical examination without abnormal findings: Secondary | ICD-10-CM | POA: Diagnosis not present

## 2018-01-31 DIAGNOSIS — Z125 Encounter for screening for malignant neoplasm of prostate: Secondary | ICD-10-CM | POA: Diagnosis not present

## 2018-02-03 DIAGNOSIS — Z1211 Encounter for screening for malignant neoplasm of colon: Secondary | ICD-10-CM | POA: Diagnosis not present

## 2018-02-03 DIAGNOSIS — Z0000 Encounter for general adult medical examination without abnormal findings: Secondary | ICD-10-CM | POA: Diagnosis not present

## 2018-07-14 DIAGNOSIS — Z23 Encounter for immunization: Secondary | ICD-10-CM | POA: Diagnosis not present

## 2018-07-24 DIAGNOSIS — J019 Acute sinusitis, unspecified: Secondary | ICD-10-CM | POA: Diagnosis not present

## 2018-07-24 DIAGNOSIS — H6123 Impacted cerumen, bilateral: Secondary | ICD-10-CM | POA: Diagnosis not present

## 2018-08-18 DIAGNOSIS — R972 Elevated prostate specific antigen [PSA]: Secondary | ICD-10-CM | POA: Diagnosis not present

## 2018-08-18 DIAGNOSIS — I861 Scrotal varices: Secondary | ICD-10-CM | POA: Diagnosis not present

## 2018-08-18 DIAGNOSIS — N401 Enlarged prostate with lower urinary tract symptoms: Secondary | ICD-10-CM | POA: Diagnosis not present

## 2018-09-08 DIAGNOSIS — L720 Epidermal cyst: Secondary | ICD-10-CM | POA: Diagnosis not present

## 2018-09-08 DIAGNOSIS — L821 Other seborrheic keratosis: Secondary | ICD-10-CM | POA: Diagnosis not present

## 2018-10-28 DIAGNOSIS — R42 Dizziness and giddiness: Secondary | ICD-10-CM | POA: Diagnosis not present

## 2018-10-28 DIAGNOSIS — I10 Essential (primary) hypertension: Secondary | ICD-10-CM | POA: Diagnosis not present

## 2018-10-28 DIAGNOSIS — H6592 Unspecified nonsuppurative otitis media, left ear: Secondary | ICD-10-CM | POA: Diagnosis not present

## 2018-11-17 DIAGNOSIS — I10 Essential (primary) hypertension: Secondary | ICD-10-CM | POA: Diagnosis not present

## 2018-11-17 DIAGNOSIS — H6122 Impacted cerumen, left ear: Secondary | ICD-10-CM | POA: Diagnosis not present

## 2018-11-17 DIAGNOSIS — R42 Dizziness and giddiness: Secondary | ICD-10-CM | POA: Diagnosis not present

## 2018-11-17 DIAGNOSIS — J069 Acute upper respiratory infection, unspecified: Secondary | ICD-10-CM | POA: Diagnosis not present

## 2018-11-21 DIAGNOSIS — Z6825 Body mass index (BMI) 25.0-25.9, adult: Secondary | ICD-10-CM | POA: Diagnosis not present

## 2018-11-21 DIAGNOSIS — J069 Acute upper respiratory infection, unspecified: Secondary | ICD-10-CM | POA: Diagnosis not present

## 2018-11-25 DIAGNOSIS — L720 Epidermal cyst: Secondary | ICD-10-CM | POA: Diagnosis not present

## 2018-12-02 DIAGNOSIS — I10 Essential (primary) hypertension: Secondary | ICD-10-CM | POA: Diagnosis not present

## 2018-12-02 DIAGNOSIS — Z6826 Body mass index (BMI) 26.0-26.9, adult: Secondary | ICD-10-CM | POA: Diagnosis not present

## 2018-12-02 DIAGNOSIS — H6122 Impacted cerumen, left ear: Secondary | ICD-10-CM | POA: Diagnosis not present

## 2018-12-30 DIAGNOSIS — Z6825 Body mass index (BMI) 25.0-25.9, adult: Secondary | ICD-10-CM | POA: Diagnosis not present

## 2018-12-30 DIAGNOSIS — I10 Essential (primary) hypertension: Secondary | ICD-10-CM | POA: Diagnosis not present

## 2018-12-30 DIAGNOSIS — R001 Bradycardia, unspecified: Secondary | ICD-10-CM | POA: Diagnosis not present

## 2019-01-22 ENCOUNTER — Other Ambulatory Visit: Payer: Self-pay

## 2019-01-22 ENCOUNTER — Inpatient Hospital Stay: Payer: 59 | Attending: Oncology | Admitting: Oncology

## 2019-01-22 ENCOUNTER — Telehealth: Payer: Self-pay | Admitting: Oncology

## 2019-01-22 VITALS — BP 144/72 | HR 51 | Temp 97.5°F | Resp 19 | Wt 187.9 lb

## 2019-01-22 DIAGNOSIS — I10 Essential (primary) hypertension: Secondary | ICD-10-CM | POA: Diagnosis not present

## 2019-01-22 DIAGNOSIS — C8208 Follicular lymphoma grade I, lymph nodes of multiple sites: Secondary | ICD-10-CM

## 2019-01-22 NOTE — Progress Notes (Signed)
  [place] OFFICE PROGRESS NOTE   Diagnosis: Non-Hodgkin's lymphoma  INTERVAL HISTORY:   Derek Brown returns as scheduled.  He feels well.  No palpable lymph nodes.  No fever or night sweats.  Good appetite.  He is now taking blood pressure medication.  He received an influenza vaccine in 2019.  Objective:  Vital signs in last 24 hours:  Blood pressure (!) 144/72, pulse (!) 51, temperature (!) 97.5 F (36.4 C), temperature source Oral, resp. rate 19, weight 187 lb 14.4 oz (85.2 kg), SpO2 100 %.    HEENT: No left parietal scalp mass, neck without mass Lymphatics: No cervical, supraclavicular, axillary, or inguinal nodes Resp: Lungs clear bilaterally Cardio: Regular rate and rhythm GI: No hepatosplenomegaly, no mass, nontender Vascular: No leg edema   Medications: I have reviewed the patient's current medications.   Assessment/Plan: 1.  Low-grade follicular lymphoma diagnosed on biopsy of a right submandibular lymph node and left intraparotid node December 2005. Treated with rituximab, last given on 04/30/2008. The palpable lymphadenopathy resolved following rituximab therapy. 2. Benign prostatic hypertrophy. 3. Admission with a lower gastrointestinal bleed, status post a colonoscopy revealing diverticulosis in August 2009. 4. History of mild neutropenia related to rituximab. 5. Removal of a left parietal scalp lesion in February 2013 with the pathology confirming non-Hodgkin's lymphoma-low-grade 2/3 follicular lymphoma 6. Chronic bradycardia 7. Subdural hematomas fall 2017-likely related to a fall 8. Hypertension    Disposition: Derek Brown is in clinical remission from non-Hodgkin's lymphoma.  He will return for an office visit in 1 year.  15 minutes were spent with the patient today.  The majority of the time was used for counseling and coordination of care.  Betsy Coder, MD  01/22/2019  8:09 AM

## 2019-01-22 NOTE — Telephone Encounter (Signed)
Scheduled appt per 2/27 los - 1 yr follow up - sent reminder letter in the mail.

## 2019-02-03 DIAGNOSIS — Z125 Encounter for screening for malignant neoplasm of prostate: Secondary | ICD-10-CM | POA: Diagnosis not present

## 2019-02-03 DIAGNOSIS — Z0000 Encounter for general adult medical examination without abnormal findings: Secondary | ICD-10-CM | POA: Diagnosis not present

## 2019-07-14 ENCOUNTER — Other Ambulatory Visit: Payer: Self-pay

## 2019-07-14 DIAGNOSIS — Z20822 Contact with and (suspected) exposure to covid-19: Secondary | ICD-10-CM

## 2019-07-15 LAB — NOVEL CORONAVIRUS, NAA: SARS-CoV-2, NAA: NOT DETECTED

## 2019-10-15 ENCOUNTER — Other Ambulatory Visit: Payer: Self-pay

## 2019-10-15 DIAGNOSIS — Z20822 Contact with and (suspected) exposure to covid-19: Secondary | ICD-10-CM

## 2019-10-17 LAB — NOVEL CORONAVIRUS, NAA: SARS-CoV-2, NAA: NOT DETECTED

## 2020-01-22 ENCOUNTER — Inpatient Hospital Stay: Payer: 59 | Attending: Oncology | Admitting: Oncology

## 2020-01-22 DIAGNOSIS — C8208 Follicular lymphoma grade I, lymph nodes of multiple sites: Secondary | ICD-10-CM

## 2020-01-22 NOTE — Progress Notes (Signed)
  [place] OFFICE VISIT PROGRESS NOTE  I connected with Elizabeth Palau on 01/22/20 at  8:00 AM EST by video and verified that I am speaking with the correct person using two identifiers.   I discussed the limitations, risks, security and privacy concerns of performing an evaluation and management service by telemedicine and the availability of in-person appointments. I also discussed with the patient that there may be a patient responsible charge related to this service. The patient expressed understanding and agreed to proceed.    Patient's location: Work Provider's location: Office   Diagnosis: Non-Hodgkin's lymphoma  INTERVAL HISTORY:   Mr. Frischman is seen today for a video visit.  This is secondary to the Covid pandemic.  He reports feeling well.  No fever or night sweats.  No palpable lymph nodes.  Good appetite.  No mass at the scalp.  He is working.  He is followed by Dr. Ernie Hew for management of hypertension.    Lab Results:  Lab Results  Component Value Date   WBC 5.1 09/20/2016   HGB 14.4 09/20/2016   HCT 43.4 09/20/2016   MCV 94.8 09/20/2016   PLT 169 09/20/2016   NEUTROABS 3.4 09/20/2016    Medications: I have reviewed the patient's current medications.  Assessment/Plan: 1. Low-grade follicular lymphoma diagnosed on biopsy of a right submandibular lymph node and left intraparotid node December 2005. Treated with rituximab, last given on 04/30/2008. The palpable lymphadenopathy resolved following rituximab therapy. 2. Benign prostatic hypertrophy. 3. Admission with a lower gastrointestinal bleed, status post a colonoscopy revealing diverticulosis in August 2009. 4. History of mild neutropenia related to rituximab. 5. Removal of a left parietal scalp lesion in February 2013 with the pathology confirming non-Hodgkin's lymphoma-low-grade 2/3 follicular lymphoma 6. Chronic bradycardia 7. Subdural hematomas fall  2017-likely related to a fall 8. Hypertension   Disposition: Mr. Derise has a history of low-grade non-Hodgkin's lymphoma.  He remains asymptomatic from lymphoma.  The plan is to continue observation.  He will be scheduled for an office visit in approximately 8 months.  He will contact us for new symptoms.  I encouraged him to obtain the COVID-19 vaccine as soon as available.   I discussed the assessment and treatment plan with the patient. The patient was provided an opportunity to ask questions and all were answered. The patient agreed with the plan and demonstrated an understanding of the instructions.   The patient was advised to call back or seek an in-person evaluation if the symptoms worsen or if the condition fails to improve as anticipated.  I provided 15 minutes of video, chart review, and documentation time during this encounter, and > 50% was spent counseling as documented under my assessment & plan.  Betsy Coder ANP/GNP-BC   01/22/2020 8:27 AM

## 2020-01-26 ENCOUNTER — Telehealth: Payer: Self-pay | Admitting: Oncology

## 2020-01-26 NOTE — Telephone Encounter (Signed)
Scheduled per 2/26 los. Called and left msg. Mailed printout  

## 2020-09-20 ENCOUNTER — Ambulatory Visit: Payer: 59 | Admitting: Oncology

## 2020-09-29 ENCOUNTER — Other Ambulatory Visit: Payer: Self-pay

## 2020-09-29 ENCOUNTER — Inpatient Hospital Stay: Payer: 59 | Attending: Oncology | Admitting: Oncology

## 2020-09-29 VITALS — BP 146/69 | HR 45 | Temp 98.1°F | Resp 16 | Ht 73.0 in | Wt 189.2 lb

## 2020-09-29 DIAGNOSIS — I10 Essential (primary) hypertension: Secondary | ICD-10-CM | POA: Insufficient documentation

## 2020-09-29 DIAGNOSIS — R001 Bradycardia, unspecified: Secondary | ICD-10-CM | POA: Diagnosis not present

## 2020-09-29 DIAGNOSIS — C8590 Non-Hodgkin lymphoma, unspecified, unspecified site: Secondary | ICD-10-CM | POA: Insufficient documentation

## 2020-09-29 DIAGNOSIS — C8208 Follicular lymphoma grade I, lymph nodes of multiple sites: Secondary | ICD-10-CM | POA: Diagnosis not present

## 2020-09-29 NOTE — Progress Notes (Signed)
°  [place] OFFICE PROGRESS NOTE   Diagnosis: Non-Hodgkin's lymphoma  INTERVAL HISTORY:   Derek Brown returns as scheduled.  He feels well.  No fever or night sweats.  Good appetite.  No palpable lymph nodes.  No palpable mass at the parietal scalp.  He has noticed a new mole at the left forehead.  He has received the COVID-19 vaccine.  Objective:  Vital signs in last 24 hours:  Blood pressure (!) 146/69, pulse (!) 45, temperature 98.1 F (36.7 C), temperature source Tympanic, resp. rate 16, height 6\' 1"  (1.854 m), weight 189 lb 3.2 oz (85.8 kg), SpO2 100 %.    HEENT: No mass at the left parietal scalp Lymphatics: No cervical, supraclavicular, axillary, or inguinal nodes Resp: Lungs clear bilaterally Cardio: Regular rate and rhythm GI: No hepatosplenomegaly, no mass, nontender Vascular: No leg edema  Skin: Benign-appearing mole at the left forehead    Lab Results:  Lab Results  Component Value Date   WBC 5.1 09/20/2016   HGB 14.4 09/20/2016   HCT 43.4 09/20/2016   MCV 94.8 09/20/2016   PLT 169 09/20/2016   NEUTROABS 3.4 09/20/2016    CMP  Lab Results  Component Value Date   NA 138 09/20/2016   K 3.9 09/20/2016   CL 104 09/20/2016   CO2 27 09/20/2016   GLUCOSE 114 (H) 09/20/2016   BUN 11 09/20/2016   CREATININE 0.60 (L) 09/20/2016   CALCIUM 9.4 09/20/2016   PROT 7.1 09/20/2016   ALBUMIN 4.3 09/20/2016   AST 21 09/20/2016   ALT 18 09/20/2016   ALKPHOS 45 09/20/2016   BILITOT 0.7 09/20/2016   GFRNONAA >60 09/20/2016   GFRAA >60 09/20/2016     Medications: I have reviewed the patient's current medications.   Assessment/Plan: 1. Low-grade follicular lymphoma diagnosed on biopsy of a right submandibular lymph node and left intraparotid node December 2005. Treated with rituximab, last given on 04/30/2008. The palpable lymphadenopathy resolved following rituximab therapy. 2. Benign prostatic hypertrophy. 3. Admission with a lower  gastrointestinal bleed, status post a colonoscopy revealing diverticulosis in August 2009. 4. History of mild neutropenia related to rituximab. 5. Removal of a left parietal scalp lesion in February 2013 with the pathology confirming non-Hodgkin's lymphoma-low-grade 2/3 follicular lymphoma 6. Chronic bradycardia 7. Subdural hematomas fall 2017-likely related to a fall 8. Hypertension    Disposition: Derek Brown is in remission from [place].  He will return for an office visit in 1 year.  The mole at the left forehead appears benign.  I recommended he follow-up with a dermatologist to evaluate multiple moles.  Betsy Coder, MD  09/29/2020  8:55 AM

## 2020-09-30 ENCOUNTER — Telehealth: Payer: Self-pay | Admitting: Oncology

## 2020-09-30 NOTE — Telephone Encounter (Signed)
Scheduled appointment per 11/4 los. Spoke to patient who is aware of appointment date and time.

## 2020-11-14 ENCOUNTER — Ambulatory Visit: Payer: Self-pay | Admitting: Podiatry

## 2020-11-28 ENCOUNTER — Ambulatory Visit (INDEPENDENT_AMBULATORY_CARE_PROVIDER_SITE_OTHER): Payer: Medicare Other | Admitting: Podiatry

## 2020-11-28 ENCOUNTER — Other Ambulatory Visit: Payer: Self-pay

## 2020-11-28 ENCOUNTER — Encounter: Payer: Self-pay | Admitting: Podiatry

## 2020-11-28 DIAGNOSIS — Q828 Other specified congenital malformations of skin: Secondary | ICD-10-CM

## 2020-11-28 DIAGNOSIS — M2012 Hallux valgus (acquired), left foot: Secondary | ICD-10-CM | POA: Diagnosis not present

## 2020-11-28 DIAGNOSIS — M21612 Bunion of left foot: Secondary | ICD-10-CM

## 2020-11-28 DIAGNOSIS — L84 Corns and callosities: Secondary | ICD-10-CM

## 2020-11-28 NOTE — Progress Notes (Signed)
  Subjective:  Patient ID: Derek Brown, male    DOB: 1956-01-06,  MRN: [id number]  Chief Complaint  Patient presents with  . Callouses    Patient presents today for painful callous bottom of left foot x couple of months.  He says it was really painful and felt like he was stepping on a nail, but since wearing corn pads, its better.    65 y.o. male presents with the above complaint. History confirmed with patient. Also has one with the side of the big toe.  Objective:  Physical Exam: warm, good capillary refill, no trophic changes or ulcerative lesions, normal DP and PT pulses and normal sensory exam. Left Foot: 2 punctate porokeratosis submet 5, pinch tyloma medial first MTPJ, he has a moderate to severe bunion with limited range of motion Assessment:   1. Porokeratosis   2. Corns and callosities      Plan:  Patient was evaluated and treated and all questions answered.  All symptomatic hyperkeratoses were safely debrided with a sterile #15 blade to patient's level of comfort without incident. We discussed preventative and palliative care of these lesions including supportive and accommodative shoegear, padding, prefabricated and custom molded accommodative orthoses, use of a pumice stone and lotions/creams daily.  Recommended he use urea cream and or salicylic acid and a pumice stone.  Also discussed how the bunion contributes to the one side of the first MTPJ, the bunion and the joint itself is not painful and he prefer to hold off on surgery at this point.  Return if symptoms worsen or fail to improve.

## 2020-11-28 NOTE — Patient Instructions (Addendum)
Look for urea 40% cream or ointment and apply to the thickened dry skin / calluses. This can be bought over the counter, at a pharmacy or online such as [employer].   Corns and Calluses Corns are small areas of thickened skin that occur on the top, sides, or tip of a toe. They contain a cone-shaped core with a point that can press on a nerve below. This causes pain.  Calluses are areas of thickened skin that can occur anywhere on the body, including the hands, fingers, palms, soles of the feet, and heels. Calluses are usually larger than corns. What are the causes? Corns and calluses are caused by rubbing (friction) or pressure, such as from shoes that are too tight or do not fit properly. What increases the risk? Corns are more likely to develop in people who have misshapen toes (toe deformities), such as hammer toes. Calluses can occur with friction to any area of the skin. They are more likely to develop in people who:  Work with their hands.  Wear shoes that fit poorly, are too tight, or are high-heeled.  Have toe deformities. What are the signs or symptoms? Symptoms of a corn or callus include:  A hard growth on the skin.  Pain or tenderness under the skin.  Redness and swelling.  Increased discomfort while wearing tight-fitting shoes, if your feet are affected. If a corn or callus becomes infected, symptoms may include:  Redness and swelling that gets worse.  Pain.  Fluid, blood, or pus draining from the corn or callus. How is this diagnosed? Corns and calluses may be diagnosed based on your symptoms, your medical history, and a physical exam. How is this treated? Treatment for corns and calluses may include:  Removing the cause of the friction or pressure. This may involve: ? Changing your shoes. ? Wearing shoe inserts (orthotics) or other protective layers in your shoes, such as a corn pad. ? Wearing gloves.  Applying medicine to the skin (topical medicine) to help  soften skin in the hardened, thickened areas.  Removing layers of dead skin with a file to reduce the size of the corn or callus.  Removing the corn or callus with a scalpel or laser.  Taking antibiotic medicines, if your corn or callus is infected.  Having surgery, if a toe deformity is the cause. Follow these instructions at home:   Take over-the-counter and prescription medicines only as told by your health care provider.  If you were prescribed an antibiotic, take it as told by your health care provider. Do not stop taking it even if your condition starts to improve.  Wear shoes that fit well. Avoid wearing high-heeled shoes and shoes that are too tight or too loose.  Wear any padding, protective layers, gloves, or orthotics as told by your health care provider.  Soak your hands or feet and then use a file or pumice stone to soften your corn or callus. Do this as told by your health care provider.  Check your corn or callus every day for symptoms of infection. Contact a health care provider if you:  Notice that your symptoms do not improve with treatment.  Have redness or swelling that gets worse.  Notice that your corn or callus becomes painful.  Have fluid, blood, or pus coming from your corn or callus.  Have new symptoms. Summary  Corns are small areas of thickened skin that occur on the top, sides, or tip of a toe.  Calluses are areas  of thickened skin that can occur anywhere on the body, including the hands, fingers, palms, and soles of the feet. Calluses are usually larger than corns.  Corns and calluses are caused by rubbing (friction) or pressure, such as from shoes that are too tight or do not fit properly.  Treatment may include wearing any padding, protective layers, gloves, or orthotics as told by your health care provider. This information is not intended to replace advice given to you by your health care provider. Make sure you discuss any questions you  have with your health care provider. Document Revised: 03/04/2019 Document Reviewed: 09/25/2017 Elsevier Patient Education  2020 [employer]

## 2021-01-09 LAB — HM COLONOSCOPY

## 2021-09-28 NOTE — Progress Notes (Signed)
  [place] OFFICE PROGRESS NOTE   Diagnosis: Non-Hodgkin's lymphoma  INTERVAL HISTORY:   [name] returns as scheduled.  He feels well.  Good appetite and energy level.  No fever or night sweats.  No palpable lymph nodes.  He has noted a nodular lesion at the right frontoparietal scalp.  Objective:  Vital signs in last 24 hours:  Blood pressure 136/73, pulse (!) 55, temperature 97.6 F (36.4 C), temperature source Temporal, resp. rate 18, weight 187 lb 6.4 oz (85 kg), SpO2 100 %.    HEENT: Neck without mass Lymphatics: No cervical, supraclavicular, axillary, or inguinal nodes Resp: Lungs clear bilaterally Cardio: Regular rate and rhythm GI: No hepatosplenomegaly, no mass, nontender Vascular: No leg edema  Skin: 0.5 cm tan raised oval lesion at the right frontoparietal scalp   Lab Results:  Lab Results  Component Value Date   WBC 5.1 09/20/2016   HGB 14.4 09/20/2016   HCT 43.4 09/20/2016   MCV 94.8 09/20/2016   PLT 169 09/20/2016   NEUTROABS 3.4 09/20/2016    CMP  Lab Results  Component Value Date   NA 138 09/20/2016   K 3.9 09/20/2016   CL 104 09/20/2016   CO2 27 09/20/2016   GLUCOSE 114 (H) 09/20/2016   BUN 11 09/20/2016   CREATININE 0.60 (L) 09/20/2016   CALCIUM 9.4 09/20/2016   PROT 7.1 09/20/2016   ALBUMIN 4.3 09/20/2016   AST 21 09/20/2016   ALT 18 09/20/2016   ALKPHOS 45 09/20/2016   BILITOT 0.7 09/20/2016   GFRNONAA >60 09/20/2016   GFRAA >60 09/20/2016   Medications: I have reviewed the patient's current medications.   Assessment/Plan: Low-grade follicular lymphoma diagnosed on biopsy of a right submandibular lymph node and left intraparotid node December 2005. Treated with rituximab, last given on 04/30/2008. The palpable lymphadenopathy resolved following rituximab therapy. Benign prostatic hypertrophy. Admission with a lower gastrointestinal bleed, status post a colonoscopy revealing diverticulosis in August 2009. History  of mild neutropenia related to rituximab. Removal of a left parietal scalp lesion in February 2013 with the pathology confirming non-Hodgkin's lymphoma-low-grade 2/3 follicular lymphoma Chronic bradycardia Subdural hematomas fall 2017-likely related to a fall Hypertension   Disposition:  [name] remains in clinical remission from non-Hodgkin's lymphoma.  He will return for an office visit in 1 year.  The raised lesion at the right frontoparietal scalp is likely a seborrheic keratosis.  This does not have the appearance typical of cutaneous lymphoma.  He will schedule an appointment with his dermatologist to evaluate this lesion.  Betsy Coder, MD  09/29/2021  8:30 AM

## 2021-09-29 ENCOUNTER — Ambulatory Visit: Payer: 59 | Admitting: Nurse Practitioner

## 2021-09-29 ENCOUNTER — Inpatient Hospital Stay: Payer: Medicare Other | Attending: Nurse Practitioner | Admitting: Oncology

## 2021-09-29 ENCOUNTER — Other Ambulatory Visit: Payer: Self-pay

## 2021-09-29 VITALS — BP 136/73 | HR 55 | Temp 97.6°F | Resp 18 | Wt 187.4 lb

## 2021-09-29 DIAGNOSIS — C8208 Follicular lymphoma grade I, lymph nodes of multiple sites: Secondary | ICD-10-CM | POA: Diagnosis not present

## 2021-09-29 DIAGNOSIS — C8590 Non-Hodgkin lymphoma, unspecified, unspecified site: Secondary | ICD-10-CM | POA: Insufficient documentation

## 2022-05-22 ENCOUNTER — Other Ambulatory Visit: Payer: Self-pay | Admitting: Surgery

## 2022-06-06 ENCOUNTER — Other Ambulatory Visit: Payer: Self-pay | Admitting: Family Medicine

## 2022-06-06 DIAGNOSIS — Z87891 Personal history of nicotine dependence: Secondary | ICD-10-CM

## 2022-06-14 ENCOUNTER — Ambulatory Visit
Admission: RE | Admit: 2022-06-14 | Discharge: 2022-06-14 | Disposition: A | Discharge status: Home / Self Care | Payer: Medicare Other | Source: Ambulatory Visit | Attending: Family Medicine | Admitting: Family Medicine

## 2022-06-14 DIAGNOSIS — Z87891 Personal history of nicotine dependence: Secondary | ICD-10-CM

## 2022-09-26 ENCOUNTER — Inpatient Hospital Stay: Payer: Medicare Other | Attending: Oncology | Admitting: Oncology

## 2022-09-26 VITALS — BP 149/79 | HR 84 | Temp 98.1°F | Resp 18 | Ht 73.0 in | Wt 187.6 lb

## 2022-09-26 DIAGNOSIS — Z8572 Personal history of non-Hodgkin lymphomas: Secondary | ICD-10-CM | POA: Insufficient documentation

## 2022-09-26 DIAGNOSIS — C8208 Follicular lymphoma grade I, lymph nodes of multiple sites: Secondary | ICD-10-CM | POA: Diagnosis not present

## 2022-09-26 NOTE — Progress Notes (Signed)
  [place] OFFICE PROGRESS NOTE   Diagnosis: Non-Hodgkin's lymphoma  INTERVAL HISTORY:   Derek Brown is as scheduled.  He feels well.  No palpable lymph nodes or skin nodules.  No fever or night sweats.  Good appetite.  He is working.  He underwent a right inguinal hernia repair last month.  Objective:  Vital signs in last 24 hours:  Blood pressure (!) 149/79, pulse 84, temperature 98.1 F (36.7 C), temperature source Oral, resp. rate 18, height '6\' 1"'$  (1.854 m), weight 187 lb 9.6 oz (85.1 kg), SpO2 99 %.    HEENT: Neck without mass Lymphatics: No cervical, supraclavicular, axillary, or inguinal nodes Resp: Lungs clear bilaterally Cardio: Regular rate and rhythm GI: No hepatosplenomegaly Vascular: No leg edema  Skin: No mass at the parietal scalp, no evidence of recurrent tumor at the right submandibular or left preauricular areas   Lab Results:  Lab Results  Component Value Date   WBC 5.1 09/20/2016   HGB 14.4 09/20/2016   HCT 43.4 09/20/2016   MCV 94.8 09/20/2016   PLT 169 09/20/2016   NEUTROABS 3.4 09/20/2016    CMP  Lab Results  Component Value Date   NA 138 09/20/2016   K 3.9 09/20/2016   CL 104 09/20/2016   CO2 27 09/20/2016   GLUCOSE 114 (H) 09/20/2016   BUN 11 09/20/2016   CREATININE 0.60 (L) 09/20/2016   CALCIUM 9.4 09/20/2016   PROT 7.1 09/20/2016   ALBUMIN 4.3 09/20/2016   AST 21 09/20/2016   ALT 18 09/20/2016   ALKPHOS 45 09/20/2016   BILITOT 0.7 09/20/2016   GFRNONAA >60 09/20/2016   GFRAA >60 09/20/2016     Medications: I have reviewed the patient's current medications.   Assessment/Plan: Low-grade follicular lymphoma diagnosed on biopsy of a right submandibular lymph node and left intraparotid node December 2005. Treated with rituximab, last given on 04/30/2008. The palpable lymphadenopathy resolved following rituximab therapy. Benign prostatic hypertrophy. Admission with a lower gastrointestinal bleed, status post a  colonoscopy revealing diverticulosis in August 2009. History of mild neutropenia related to rituximab. Removal of a left parietal scalp lesion in February 2013 with the pathology confirming non-Hodgkin's lymphoma-low-grade 2/3 follicular lymphoma Chronic bradycardia Subdural hematomas fall 2017-likely related to a fall Hypertension     Disposition: Derek Brown is in clinical remission from non-Hodgkin's lymphoma.  He would like to continue follow-up at the Cancer center.  He will return for an office visit in 1 year.  He will call in the interim for new symptoms.  He reports being up-to-date on influenza, COVID-19, and RSV vaccines.  Betsy Coder, MD  09/26/2022  8:49 AM

## 2023-09-27 ENCOUNTER — Inpatient Hospital Stay: Payer: Medicare Other | Attending: Oncology | Admitting: Oncology

## 2023-09-27 ENCOUNTER — Encounter: Payer: Self-pay | Admitting: Oncology

## 2023-09-27 VITALS — BP 124/75 | HR 41 | Temp 98.2°F | Resp 18 | Ht 73.0 in | Wt 186.3 lb

## 2023-09-27 DIAGNOSIS — C8208 Follicular lymphoma grade I, lymph nodes of multiple sites: Secondary | ICD-10-CM

## 2023-09-27 DIAGNOSIS — C828A Other types of follicular lymphoma, in remission: Secondary | ICD-10-CM | POA: Diagnosis present

## 2023-09-27 NOTE — Progress Notes (Signed)
  [facility] OFFICE PROGRESS NOTE   Diagnosis: Non-Hodgkin's lymphoma  INTERVAL HISTORY:   Derek Brown returns as scheduled.  He feels well.  Good appetite.  No palpable lymph nodes.  No complaint.  He is working.  Objective:  Vital signs in last 24 hours:  Blood pressure 124/75, pulse (!) 41, temperature 98.2 F (36.8 C), temperature source Temporal, resp. rate 18, height 6\' 1"  (1.854 m), weight 186 lb 4.8 oz (84.5 kg), SpO2 98%.    HEENT: Neck without mass Lymphatics: No cervical, supraclavicular, axillary, or inguinal nodes Resp: Lungs clear bilaterally Cardio: Regular rate and rhythm, bradycardia GI: No mass, nontender, no hepatosplenomegaly Vascular: No leg edema   Lab Results:  Lab Results  Component Value Date   WBC 5.1 09/20/2016   HGB 14.4 09/20/2016   HCT 43.4 09/20/2016   MCV 94.8 09/20/2016   PLT 169 09/20/2016   NEUTROABS 3.4 09/20/2016    CMP  Lab Results  Component Value Date   NA 138 09/20/2016   K 3.9 09/20/2016   CL 104 09/20/2016   CO2 27 09/20/2016   GLUCOSE 114 (H) 09/20/2016   BUN 11 09/20/2016   CREATININE 0.60 (L) 09/20/2016   CALCIUM 9.4 09/20/2016   PROT 7.1 09/20/2016   ALBUMIN 4.3 09/20/2016   AST 21 09/20/2016   ALT 18 09/20/2016   ALKPHOS 45 09/20/2016   BILITOT 0.7 09/20/2016   GFRNONAA >60 09/20/2016   GFRAA >60 09/20/2016      Medications: I have reviewed the patient's current medications.   Assessment/Plan: Low-grade follicular lymphoma diagnosed on biopsy of a right submandibular lymph node and left intraparotid node December 2005. Treated with rituximab, last given on 04/30/2008. The palpable lymphadenopathy resolved following rituximab therapy. Benign prostatic hypertrophy. Admission with a lower gastrointestinal bleed, status post a colonoscopy revealing diverticulosis in August 2009. History of mild neutropenia related to rituximab. Removal of a left parietal scalp lesion in February 2013 with  the pathology confirming non-Hodgkin's lymphoma-low-grade 2/3 follicular lymphoma Chronic bradycardia Subdural hematomas fall 2017-likely related to a fall Hypertension      Disposition: Derek Brown is in clinical remission from lymphoma.  He will return for an office visit in 1 year.  He will call for new symptoms.  Thornton Papas, MD  09/27/2023  8:35 AM

## 2024-02-18 ENCOUNTER — Encounter: Payer: Self-pay | Admitting: Family Medicine

## 2024-02-18 ENCOUNTER — Ambulatory Visit: Payer: 59 | Admitting: Family Medicine

## 2024-02-18 VITALS — BP 129/70 | HR 55 | Temp 97.9°F | Ht 73.0 in | Wt 185.6 lb

## 2024-02-18 DIAGNOSIS — Z8679 Personal history of other diseases of the circulatory system: Secondary | ICD-10-CM | POA: Diagnosis not present

## 2024-02-18 DIAGNOSIS — I10 Essential (primary) hypertension: Secondary | ICD-10-CM | POA: Insufficient documentation

## 2024-02-18 DIAGNOSIS — C8211 Follicular lymphoma grade II, lymph nodes of head, face, and neck: Secondary | ICD-10-CM

## 2024-02-18 DIAGNOSIS — Z8719 Personal history of other diseases of the digestive system: Secondary | ICD-10-CM | POA: Insufficient documentation

## 2024-02-18 DIAGNOSIS — C8208 Follicular lymphoma grade I, lymph nodes of multiple sites: Secondary | ICD-10-CM

## 2024-02-18 DIAGNOSIS — J301 Allergic rhinitis due to pollen: Secondary | ICD-10-CM | POA: Diagnosis not present

## 2024-02-18 DIAGNOSIS — N401 Enlarged prostate with lower urinary tract symptoms: Secondary | ICD-10-CM

## 2024-02-18 DIAGNOSIS — R972 Elevated prostate specific antigen [PSA]: Secondary | ICD-10-CM

## 2024-02-18 DIAGNOSIS — R001 Bradycardia, unspecified: Secondary | ICD-10-CM

## 2024-02-18 DIAGNOSIS — Z8572 Personal history of non-Hodgkin lymphomas: Secondary | ICD-10-CM

## 2024-02-18 DIAGNOSIS — G4733 Obstructive sleep apnea (adult) (pediatric): Secondary | ICD-10-CM

## 2024-02-18 NOTE — Patient Instructions (Addendum)
 It was so good seeing you again! Thank you for establishing with my new practice and allowing me to continue caring for you. It means a lot to me.   Please schedule a follow up appointment with me in 3-6 months for your annual complete physical; please come fasting.    VISIT SUMMARY:  Derek Brown, during today's visit, we reviewed your hypertension management, chronic bradycardia, obstructive sleep apnea, and general health maintenance. We discussed changes to your medication and addressed your concerns about CPAP compliance. We also reviewed your history of lymphoma and benign prostatic hyperplasia, confirming that both are currently well-managed.  YOUR PLAN:  -HYPERTENSION: Hypertension, or high blood pressure, is being managed with losartan. Your blood pressure was slightly elevated today, possibly due to stress and a missed dose. We have decided to discontinue carvedilol to address your bradycardia and will monitor your blood pressure at home for 2-4 weeks. If it remains high, we may add a diuretic to your treatment plan.  -CHRONIC BRADYCARDIA: Chronic bradycardia means your heart rate is slower than normal, which can be worsened by carvedilol. To prevent further reduction in your heart rate, we have discontinued carvedilol.  -OBSTRUCTIVE SLEEP APNEA: Obstructive sleep apnea is a condition where your breathing stops and starts during sleep. You have been experiencing issues with CPAP compliance due to discomfort. We are referring you to a pulmonologist to help manage and improve your CPAP use.  -LYMPHOMA IN REMISSION: Your lymphoma is in remission, meaning there are no signs of cancer currently. You will continue to have annual follow-ups with your oncologist to monitor your condition.  -BENIGN PROSTATIC HYPERPLASIA (BPH): Benign prostatic hyperplasia is an enlarged prostate gland. Your symptoms are well controlled without medication, and your numbers are stable. Continue with your annual urology  follow-ups.  -GENERAL HEALTH MAINTENANCE: Your general health maintenance is up to date, but we need to verify your immunization status. We will request records from your previous providers and schedule a physical exam in the summer. We will also want records from Dr. Donavan Burnet office to document your colonosopies.   INSTRUCTIONS:  Please monitor your blood pressure at home for the next 2-4 weeks and record the readings. Follow up with the pulmonologist for your CPAP compliance issues. We will request your previous medical records to verify your immunization status and schedule a physical exam in the summer.

## 2024-02-18 NOTE — Progress Notes (Signed)
 Subjective  CC:  Chief Complaint  Patient presents with   Establish Care    HPI: Derek [place] is a 68 y.o. male who presents to [facility] today to establish care with me as a new patient.  Former patient of mine at [facility] years ago. I reviewed available charts from specialists including oncology and urology. He has the following concerns or needs: Discussed the use of AI scribe software for clinical note transcription with the patient, who gave verbal consent to proceed.  History of Present Illness   Derek [place] is a 68 year old male with hypertension who presents for medication management and follow-up.  He has been managing hypertension with losartan and carvedilol for several years. His blood pressure is generally well-controlled, typically in the 130s/80s, but can drop to the 120s/70s with regular exercise. Today, his blood pressure was elevated, which he attributes to stress and possibly missing a dose of losartan last night. He takes losartan at bedtime and carvedilol twice daily with meals. He monitors his blood pressure at home, noting it is usually in the 140s initially and decreases with exercise. His diastolic pressure remains below 80.  He experiences chronic bradycardia with heart rates in the 40s, which is likely exacerbated by carvedilol. This bradycardia is not ideal for someone who exercises regularly.  He was diagnosed with obstructive sleep apnea and started using a CPAP machine at the beginning of the year due to significant snoring, confirmed by a home sleep study. He struggles with CPAP compliance, finding it uncomfortable and often removing it during the night. He has not had significant follow-up regarding CPAP use.  He has a history of lymphoma, which is currently in remission. He continues to follow up with his oncologist annually for monitoring.  He sees a urologist annually for prostate management but is not currently on medication  for it. He was previously on medication for about a year, but his numbers have since normalized.  His family history includes a father who had high blood pressure and died of a heart attack at 23, a mother who had breast cancer and passed away at 32, and a brother who also has high blood pressure but does not exercise regularly.  He is a bicyclist and exercises regularly. He has been working at the same job since 2010 and is considering retirement. He has a 35 year old daughter who is paying for her graduate school while he is still paying for her undergraduate loans.  No significant symptoms related to prostate issues. Reports snoring as a symptom of sleep apnea.       Assessment  1. Essential hypertension   2. Seasonal allergic rhinitis due to pollen   3. History of subdural hematoma   4. History of GI diverticular bleed   5. OSA on CPAP   6. Grade 1 follicular lymphoma of lymph nodes of multiple regions (HCC) Chronic  7. Elevated prostate specific antigen (PSA)   8. Benign nodular prostatic hyperplasia with lower urinary tract symptoms   9. Follicular lymphoma grade II of lymph nodes of head, face, and neck (HCC)   10. History of non-Hodgkin's lymphoma   11. Sinus bradycardia      Plan  Assessment and Plan    Hypertension Hypertension managed with losartan and carvedilol. Repeat bp at goal today in office.  Carvedilol discontinued due to bradycardia and exercise limitations. - Discontinue carvedilol. - Monitor blood pressure at home for 2-4 weeks. - Consider  adding a diuretic to losartan if blood pressure is not controlled. Or amlodipine.  Chronic Bradycardia Chronic bradycardia likely exacerbated by carvedilol. Discontinuation to prevent further heart rate reduction. - Discontinue carvedilol.  Obstructive Sleep Apnea Experiencing CPAP compliance issues due to discomfort and mask problems. Referred to pulmonology for further management. - Refer to pulmonology for CPAP  management and compliance assessment. May warrant another sleep study  Lymphoma in remission Lymphoma in remission with annual oncologist monitoring.  Benign Prostatic Hyperplasia (BPH) Symptoms well controlled without medication. Numbers stable. - Continue annual urology follow-up.  General Health Maintenance General health maintenance up to date. Immunization status needs verification. He reports he has had shingles and tdap. Records from previous providers will be requested. - Request records from previous providers to verify immunization status and last physical exam details. - Schedule a physical exam in the summer.        Follow up:  3-6 mo for cpe Orders Placed This Encounter  Procedures   Ambulatory referral to Pulmonology   No orders of the defined types were placed in this encounter.       02/18/2024    9:40 AM  Depression screen PHQ 2/9  Decreased Interest 0  Down, Depressed, Hopeless 0  PHQ - 2 Score 0  Altered sleeping 0  Tired, decreased energy 0  Change in appetite 0  Feeling bad or failure about yourself  0  Trouble concentrating 0  Moving slowly or fidgety/restless 0  Suicidal thoughts 0  PHQ-9 Score 0  Difficult doing work/chores Not difficult at all    We updated and reviewed the patient's past history in detail and it is documented below.  Patient Active Problem List   Diagnosis Date Noted Date Diagnosed   Essential hypertension 02/18/2024     Priority: High   Elevated prostate specific antigen (PSA) 07/23/2016     Priority: High   Benign nodular prostatic hyperplasia with lower urinary tract symptoms 07/26/2014     Priority: High   Other malignant lymphomas, unspecified site, extranodal and solid organ sites 01/15/2014     Priority: High   Follicular lymphoma grade II of lymph nodes of head, face, and neck (HCC) 07/19/2012     Priority: High   History of non-Hodgkin's lymphoma 04/04/2010     Priority: High    Formatting of this note might  be different from the original. Non-Hodgkin's Lymphoma - Dr Truett Perna  remission in 2011    History of subdural hematoma 02/18/2024     Priority: Medium    History of GI diverticular bleed 02/18/2024     Priority: Medium    Sinus bradycardia 12/26/2016     Priority: Medium    Scrotal varices 07/26/2014     Priority: Low   Allergic rhinitis 04/04/2010     Priority: Low   History of alcoholism (HCC) 02/21/2009     Priority: Low    Alcoholism - h/o remote use in the 80s     Health Maintenance  Topic Date Due   Medicare Annual Wellness (AWV)  Never done   Hepatitis C Screening  Never done   Zoster Vaccines- Shingrix (1 of 2) 11/15/1974   Pneumonia Vaccine 3+ Years old (2 of 2 - PCV) 11/02/2016   INFLUENZA VACCINE  02/24/2024 (Originally 06/27/2023)   COVID-19 Vaccine (5 - 2024-25 season) 03/05/2024 (Originally 07/28/2023)   DTaP/Tdap/Td (3 - Td or Tdap) 02/04/2028   Colonoscopy  02/24/2033   HPV VACCINES  Aged [employer] History  Administered Date(s) Administered   Fluad Quad(high Dose 65+) 09/08/2016   Influenza Inj Mdck Quad Pf 09/05/2017, 07/14/2018   Influenza Split 08/26/2012, 08/06/2013   Influenza, Seasonal, Injecte, Preservative Fre 11/03/2015   Influenza,inj,Quad PF,6+ Mos 08/29/2014, 11/03/2015, 09/08/2016   Influenza,trivalent, recombinat, inj, PF 08/26/2012, 08/06/2013   Influenza-Unspecified 07/27/2018, 07/27/2022   PFIZER(Purple Top)SARS-COV-2 Vaccination 02/01/2020, 02/24/2020, 08/09/2020   Pfizer Covid-19 Vaccine Bivalent Booster 67yrs & up 08/25/2022   Pneumococcal Polysaccharide-23 11/03/2015   Respiratory Syncytial Virus Vaccine,Recomb Aduvanted(Arexvy) 09/12/2022   Tdap 12/14/2015, 02/03/2018   Zoster, Live 12/26/2016   Current Meds  Medication Sig   cetirizine (ZYRTEC) 10 MG tablet Take 10 mg by mouth daily.    losartan (COZAAR) 100 MG tablet Take 100 mg by mouth at bedtime.   [DISCONTINUED] carvedilol (COREG) 6.25 MG tablet Take 6.25 mg by  mouth 2 (two) times daily.    Allergies: Patient has no known allergies. Past Medical History Patient  has a past medical history of Cataract, Hypertension, Non-Hodgkin lymphoma (HCC), Right inguinal hernia (07/26/2014), and Sleep apnea. Past Surgical History Patient  has a past surgical history that includes Hernia repair. Family History: Patient family history includes Breast cancer in his mother; Depression in his maternal grandfather; Heart disease (age of onset: 25) in his father; Hypertension in his brother and father. Social History:  Patient  reports that he quit smoking about 46 years ago. His smoking use included cigarettes. He started smoking about 51 years ago. He has a 10 pack-year smoking history. He has never used smokeless tobacco. He reports that he does not drink alcohol and does not use drugs.  Review of Systems: Constitutional: negative for fever or malaise Ophthalmic: negative for photophobia, double vision or loss of vision Cardiovascular: negative for chest pain, dyspnea on exertion, or new LE swelling Respiratory: negative for SOB or persistent cough Gastrointestinal: negative for abdominal pain, change in bowel habits or melena Genitourinary: negative for dysuria or gross hematuria Musculoskeletal: negative for new gait disturbance or muscular weakness Integumentary: negative for new or persistent rashes Neurological: negative for TIA or stroke symptoms Psychiatric: negative for SI or delusions Allergic/Immunologic: negative for hives  Patient Care Team    Relationship Specialty Notifications Start End  Willow Ora, MD PCP - General Family Medicine  02/18/24   Esaw Dace, MD Consulting Physician Urology  02/18/24   Ladene Artist, MD Consulting Physician Oncology  02/18/24   Abigail Miyamoto, MD Consulting Physician General Surgery  02/18/24   Edwin Cap, DPM Consulting Physician Podiatry  02/18/24     Objective  Vitals: BP 129/70 (BP  Location: Right Arm, Patient Position: Supine, Cuff Size: Normal) Comment: cla  Pulse (!) 55   Temp 97.9 F (36.6 C)   Ht 6\' 1"  (1.854 m)   Wt 185 lb 9.6 oz (84.2 kg)   SpO2 97%   BMI 24.49 kg/m  General:  Well developed, well nourished, no acute distress  Psych:  Alert and oriented,normal mood and affect HEENT:  Normocephalic, atraumatic, non-icteric sclera, supple neck without adenopathy, mass or thyromegaly Cardiovascular:  RRR without gallop, rub or murmur Respiratory:  Good breath sounds bilaterally, CTAB with normal respiratory effort Gastrointestinal: normal bowel sounds, soft, non-tender, no noted masses. No HSM MSK: no deformities, contusions. Joints are without erythema or swelling Skin:  Warm, no rashes or suspicious lesions noted Neurologic:    Mental status is normal. Gross motor and sensory exams are normal. Normal gait  Commons side effects, risks, benefits, and  alternatives for medications and treatment plan prescribed today were discussed, and the patient expressed understanding of the given instructions. Patient is instructed to call or message via MyChart if he/she has any questions or concerns regarding our treatment plan. No barriers to understanding were identified. We discussed Red Flag symptoms and signs in detail. Patient expressed understanding regarding what to do in case of urgent or emergency type symptoms.  Medication list was reconciled, printed and provided to the patient in AVS. Patient instructions and summary information was reviewed with the patient as documented in the AVS. This note was prepared with assistance of Dragon voice recognition software. Occasional wrong-word or sound-a-like substitutions may have occurred due to the inherent limitations of voice recognition software  This visit occurred during the SARS-CoV-2 public health emergency.  Safety protocols were in place, including screening questions prior to the visit, additional usage of staff PPE,  and extensive cleaning of exam room while observing appropriate contact time as indicated for disinfecting solutions.

## 2024-02-28 ENCOUNTER — Other Ambulatory Visit: Payer: Self-pay | Admitting: Family Medicine

## 2024-02-28 MED ORDER — LOSARTAN POTASSIUM 100 MG PO TABS: 100.0000 mg | ORAL_TABLET | Freq: Every evening | ORAL | 3 refills | Status: AC

## 2024-02-28 NOTE — Telephone Encounter (Signed)
 Copied from CRM [id number]. Topic: Clinical - Medication Refill >> Feb 28, 2024  9:18 AM Carlatta H wrote: Most Recent Primary Care Visit:  Provider: Willow Ora  Department: [facility]  Visit Type: NEW PT - OFFICE VISIT  Date: 02/18/2024  Medication: losartan (COZAAR) 100 MG tablet [[id number]]  Has the patient contacted their pharmacy? No (Agent: If no, request that the patient contact the pharmacy for the refill. If patient does not wish to contact the pharmacy document the reason why and proceed with request.) (Agent: If yes, when and what did the pharmacy advise?)  Is this the correct pharmacy for this prescription? Yes If no, delete pharmacy and type the correct one.  This is the patient's preferred pharmacy:  [id number] - [place] - [address] AT [facility] [address]  [place] [address] Phone: [phone] Fax: [phone]   Has the prescription been filled recently? No  Is the patient out of the medication? No  Has the patient been seen for an appointment in the last year OR does the patient have an upcoming appointment? Yes  Can we respond through MyChart? Yes  Agent: Please be advised that Rx refills may take up to 3 business days. We ask that you follow-up with your pharmacy.

## 2024-03-05 ENCOUNTER — Telehealth: Payer: Self-pay | Admitting: *Deleted

## 2024-03-05 ENCOUNTER — Ambulatory Visit: Admitting: Adult Health

## 2024-03-05 ENCOUNTER — Encounter: Payer: Self-pay | Admitting: Adult Health

## 2024-03-05 VITALS — BP 128/70 | HR 46 | Ht 72.0 in | Wt 185.0 lb

## 2024-03-05 DIAGNOSIS — G4733 Obstructive sleep apnea (adult) (pediatric): Secondary | ICD-10-CM | POA: Diagnosis not present

## 2024-03-05 NOTE — Telephone Encounter (Signed)
 Called and left a VM for Brad with [employer] to have him tag [place] in the patient's airview account.

## 2024-03-05 NOTE — Assessment & Plan Note (Signed)
 Severe OSA - patient education on OSA and CPAP care  - discussed how weight can impact sleep and risk for sleep disordered breathing - discussed options to assist with weight loss: combination of diet modification, cardiovascular and strength training exercises   - had an extensive discussion regarding the adverse health consequences related to untreated sleep disordered breathing - specifically discussed the risks for hypertension, coronary artery disease, cardiac dysrhythmias, cerebrovascular disease, and diabetes - lifestyle modification discussed   - discussed how sleep disruption can increase risk of accidents, particularly when driving - safe driving practices were discussed   Having trouble tolerating CPAP . -adjust CPAP pressure for comfort -auto CPAp 5-12cmH2o.  Change to nasal mask.  If not able to tolerate , CPAP titration vs Inspire evaluation   Plan  Patient Instructions  Change CPAP pressure to auto CPAP 5=12cmH2O.  Try Resmed N30i nasal mask .  Continue on CPAP At bedtime, wear all night long .  Do not drive if sleepy Follow up in 6 weeks -Virtual visit and As needed

## 2024-03-05 NOTE — Patient Instructions (Addendum)
 Change CPAP pressure to auto CPAP 5=12cmH2O.  Try Resmed N30i nasal mask .  Continue on CPAP At bedtime, wear all night long .  Do not drive if sleepy Follow up in 6 weeks -Virtual visit and As needed

## 2024-03-05 NOTE — Progress Notes (Signed)
 @Patient  ID: Derek [place], male    DOB: 1956/01/14, 68 y.o.   MRN: [id number]  Chief Complaint  Patient presents with   Consult    Referring provider: Willow Ora, MD  HPI: 68 year old male seen for sleep consult March 05, 2024 to establish for sleep apnea    TEST/EVENTS :  Home sleep study September 22, 2023 showed severe sleep apnea with AHI of 48.4/hour and SpO2 low at 80%  .03/05/2024 Sleep consult  Patient presents today for a sleep consult to establish for sleep apnea.  Kindly referred by primary care provider Dr. Mardelle Matte.  Patient says he has had longstanding loud snoring.  This has been disruptive to his spouse.  He has also had witnessed apneic events and daytime fatigue.  Typically goes to bed about 10 PM.  Goes to sleep pretty quickly.  Feels that his sleep is somewhat restless.  Gets up 2-3 times a night.  Gets up at 5 AM to go to work.  As above had sleep study done October 2024.  That showed severe sleep apnea with AHI of 48.4/hour and SpO2 level at 80%.  There was 14% central events.  Patient uses no sleep aid.  Has no history of congestive heart failure or stroke.  Does not nap.  Gets in 1 to 2 cups of caffeine daily.  Has a medical history significant for hypertension and lymphoma.  Says he had previous sinus surgery in the past his snoring did get better shortly after this but then over the years has gotten worse.  Epworth score is 7 out of 24.  Typically gets sleepy if he sits down to rest, watch TV and in the evening hours. No symptoms suspicious for cataplexy or sleep paralysis. Patient was started on CPAP therapy in January.  Says he had a very difficult time tolerating CPAP.  Says the pressure healed very high.  Can only wear it for about an hour at a time.  CPAP download shows good compliance but daily average usage only around 1 hour.  Patient is on auto CPAP 5 to 20 cm H2O.  AHI 11.5/hour daily average pressure at 7.7 cm H2O.  Significant mask leaks.  He is currently  tried 2 different fullface mask.  Patient does have a mustache and beard.  Social history patient is married.  Has children.  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  He is a former smoker.  No alcohol or drug use.  Patient is very active.  Exercises and rides his bike.  Family history positive for hypertension and cancer.   No Known Allergies  Immunization History  Administered Date(s) Administered   Fluad Quad(high Dose 65+) 09/08/2016   Influenza Inj Mdck Quad Pf 09/05/2017, 07/14/2018   Influenza Split 08/26/2012, 08/06/2013   Influenza, Seasonal, Injecte, Preservative Fre 11/03/2015   Influenza,inj,Quad PF,6+ Mos 08/29/2014, 11/03/2015, 09/08/2016   Influenza,trivalent, recombinat, inj, PF 08/26/2012, 08/06/2013   Influenza-Unspecified 07/27/2018, 07/27/2022   PFIZER(Purple Top)SARS-COV-2 Vaccination 02/01/2020, 02/24/2020, 08/09/2020   Pfizer Covid-19 Vaccine Bivalent Booster 64yrs & up 08/25/2022   Pneumococcal Polysaccharide-23 11/03/2015   Respiratory Syncytial Virus Vaccine,Recomb Aduvanted(Arexvy) 09/12/2022   Tdap 12/14/2015, 02/03/2018   Zoster, Live 12/26/2016    Past Medical History:  Diagnosis Date   Cataract    Hypertension    Non-Hodgkin lymphoma (HCC)    Right inguinal hernia 07/26/2014   Sleep apnea     Tobacco History: Social History   Tobacco Use  Smoking Status Former   Current  packs/day: 0.00   Average packs/day: 1 pack/day for 10.0 years (10.0 ttl pk-yrs)   Types: Cigarettes   Start date: 11/26/1972   Quit date: 12/27/1977   Years since quitting: 46.2  Smokeless Tobacco Never   Counseling given: Not Answered   Outpatient Medications Prior to Visit  Medication Sig Dispense Refill   cetirizine (ZYRTEC) 10 MG tablet Take 10 mg by mouth daily.      losartan (COZAAR) 100 MG tablet Take 1 tablet (100 mg total) by mouth at bedtime. 90 tablet 3   No facility-administered medications prior to visit.     Review of Systems:    Constitutional:   No  weight loss, night sweats,  Fevers, chills,+ fatigue, or  lassitude.  HEENT:   No headaches,  Difficulty swallowing,  Tooth/dental problems, or  Sore throat,                No sneezing, itching, ear ache, nasal congestion, post nasal drip,   CV:  No chest pain,  Orthopnea, PND, swelling in lower extremities, anasarca, dizziness, palpitations, syncope.   GI  No heartburn, indigestion, abdominal pain, nausea, vomiting, diarrhea, change in bowel habits, loss of appetite, bloody stools.   Resp: No shortness of breath with exertion or at rest.  No excess mucus, no productive cough,  No non-productive cough,  No coughing up of blood.  No change in color of mucus.  No wheezing.  No chest wall deformity  Skin: no rash or lesions.  GU: no dysuria, change in color of urine, no urgency or frequency.  No flank pain, no hematuria   MS:  No joint pain or swelling.  No decreased range of motion.  No back pain.    Physical Exam  BP 128/70 (BP Location: Left Arm, Patient Position: Sitting, Cuff Size: Normal)   Pulse (!) 46   Ht 6' (1.829 m)   Wt 185 lb (83.9 kg)   SpO2 100%   BMI 25.09 kg/m   GEN: A/Ox3; pleasant , NAD, well nourished    HEENT:  [place]/AT, NOSE-clear, THROAT-clear, no lesions, no postnasal drip or exudate noted. Class 3 MP airway   NECK:  Supple w/ fair ROM; no JVD; normal carotid impulses w/o bruits; no thyromegaly or nodules palpated; no lymphadenopathy.    RESP  Clear  P & A; w/o, wheezes/ rales/ or rhonchi. no accessory muscle use, no dullness to percussion  CARD:  RRR, no m/r/g, no peripheral edema, pulses intact, no cyanosis or clubbing.  GI:   Soft & nt; nml bowel sounds; no organomegaly or masses detected.   Musco: Warm bil, no deformities or joint swelling noted.   Neuro: alert, no focal deficits noted.    Skin: Warm, no lesions or rashes    Lab Results:  CBC    Component Value Date/Time   WBC 5.1 09/20/2016 1156   RBC 4.58  09/20/2016 1156   HGB 14.4 09/20/2016 1156   HGB 14.6 10/22/2008 0915   HCT 43.4 09/20/2016 1156   HCT 42.2 10/22/2008 0915   PLT 169 09/20/2016 1156   PLT 183 10/22/2008 0915   MCV 94.8 09/20/2016 1156   MCV 90.5 10/22/2008 0915   MCH 31.4 09/20/2016 1156   MCHC 33.2 09/20/2016 1156   RDW 13.4 09/20/2016 1156   RDW 13.0 10/22/2008 0915   LYMPHSABS 1.2 09/20/2016 1156   LYMPHSABS 0.9 10/22/2008 0915   MONOABS 0.4 09/20/2016 1156   MONOABS 0.4 10/22/2008 0915   EOSABS 0.1 09/20/2016 1156  EOSABS 0.1 10/22/2008 0915   BASOSABS 0.0 09/20/2016 1156   BASOSABS 0.0 10/22/2008 0915    BMET    Component Value Date/Time   NA 138 09/20/2016 1156   K 3.9 09/20/2016 1156   CL 104 09/20/2016 1156   CO2 27 09/20/2016 1156   GLUCOSE 114 (H) 09/20/2016 1156   BUN 11 09/20/2016 1156   CREATININE 0.60 (L) 09/20/2016 1156   CALCIUM 9.4 09/20/2016 1156   GFRNONAA >60 09/20/2016 1156   GFRAA >60 09/20/2016 1156    BNP No results found for: "BNP"  ProBNP No results found for: "PROBNP"  Imaging: No results found.  Administration History     None           No data to display          No results found for: "NITRICOXIDE"      Assessment & Plan:   No problem-specific Assessment & Plan notes found for this encounter.     Rubye Oaks, NP 03/05/2024

## 2024-05-11 ENCOUNTER — Telehealth (INDEPENDENT_AMBULATORY_CARE_PROVIDER_SITE_OTHER): Admitting: Adult Health

## 2024-05-11 ENCOUNTER — Encounter: Payer: Self-pay | Admitting: Adult Health

## 2024-05-11 DIAGNOSIS — G4733 Obstructive sleep apnea (adult) (pediatric): Secondary | ICD-10-CM

## 2024-05-11 NOTE — Progress Notes (Unsigned)
 Virtual Visit via Video Note  I connected with Derek [place] on 05/11/24 at  9:00 AM EDT by a video enabled telemedicine application and verified that I am speaking with the correct person using two identifiers.  Location: Patient: Home  Provider: Office    I discussed the limitations of evaluation and management by telemedicine and the availability of in person appointments. The patient expressed understanding and agreed to proceed.  History of Present Illness: Discussed the use of AI scribe software for clinical note transcription with the patient, who gave verbal consent to proceed.  History of Present Illness   Derek [place] is a 68 year old male with severe sleep apnea who presents with difficulty tolerating CPAP therapy.  He was diagnosed with severe sleep apnea in October, experiencing approximately 48 episodes per hour. He initially used a CPAP machine but struggled with tolerance, leading to its return due to insufficient usage. He wakes up a couple of times at night, uncertain if it's due to sleep apnea or the need to urinate. His snoring remains loud, as noted by others.  He has tried two types of CPAP masks: one covering his whole mouth and nose, and another under his nose but covering his mouth. Neither provided comfort, and he was unable to acclimate to them. He is interested in trying a nasal mask that goes under the nose, believing it might be more comfortable.        Observations/Objective: Home sleep study September 22, 2023 showed severe sleep apnea with AHI of 48.4/hour and SpO2 low at 80%   05/11/2024 Appears in NAD   Assessment and Plan:   Follow Up Instructions:    I discussed the assessment and treatment plan with the patient. The patient was provided an opportunity to ask questions and all were answered. The patient agreed with the plan and demonstrated an understanding of the instructions.   The patient was advised to call back or seek an in-person evaluation  if the symptoms worsen or if the condition fails to improve as anticipated.  I provided *** minutes of non-face-to-face time during this encounter.   Roena Clark, NP

## 2024-05-12 NOTE — Patient Instructions (Addendum)
 Set up for CPAP titration study.  Healthy sleep regimen  Do not drive if sleepy  Follow up in 3 months and As needed

## 2024-07-01 ENCOUNTER — Encounter: Payer: Self-pay | Admitting: Family Medicine

## 2024-07-01 ENCOUNTER — Ambulatory Visit: Admitting: Family Medicine

## 2024-07-01 VITALS — BP 116/70 | HR 55 | Temp 97.8°F | Ht 72.0 in | Wt 185.2 lb

## 2024-07-01 DIAGNOSIS — C8211 Follicular lymphoma grade II, lymph nodes of head, face, and neck: Secondary | ICD-10-CM | POA: Diagnosis not present

## 2024-07-01 DIAGNOSIS — G4733 Obstructive sleep apnea (adult) (pediatric): Secondary | ICD-10-CM | POA: Diagnosis not present

## 2024-07-01 DIAGNOSIS — N401 Enlarged prostate with lower urinary tract symptoms: Secondary | ICD-10-CM

## 2024-07-01 DIAGNOSIS — I10 Essential (primary) hypertension: Secondary | ICD-10-CM

## 2024-07-01 DIAGNOSIS — R001 Bradycardia, unspecified: Secondary | ICD-10-CM | POA: Diagnosis not present

## 2024-07-01 DIAGNOSIS — Z1159 Encounter for screening for other viral diseases: Secondary | ICD-10-CM

## 2024-07-01 DIAGNOSIS — Z23 Encounter for immunization: Secondary | ICD-10-CM

## 2024-07-01 DIAGNOSIS — F1021 Alcohol dependence, in remission: Secondary | ICD-10-CM

## 2024-07-01 DIAGNOSIS — Z0001 Encounter for general adult medical examination with abnormal findings: Secondary | ICD-10-CM

## 2024-07-01 LAB — LIPID PANEL
Cholesterol: 189 mg/dL (ref 0–200)
HDL: 77.5 mg/dL (ref >39.00)
LDL Cholesterol: 95 mg/dL (ref 0–99)
NonHDL: 111.84
Total CHOL/HDL Ratio: 2
Triglycerides: 85 mg/dL (ref 0.0–149.0)
VLDL: 17 mg/dL (ref 0.0–40.0)

## 2024-07-01 LAB — COMPREHENSIVE METABOLIC PANEL WITH GFR
ALT: 23 U/L (ref 0–53)
AST: 23 U/L (ref 0–37)
Albumin: 4.3 g/dL (ref 3.5–5.2)
Alkaline Phosphatase: 43 U/L (ref 39–117)
BUN: 12 mg/dL (ref 6–23)
CO2: 29 meq/L (ref 19–32)
Calcium: 9.3 mg/dL (ref 8.4–10.5)
Chloride: 104 meq/L (ref 96–112)
Creatinine, Ser: 0.83 mg/dL (ref 0.40–1.50)
GFR: 89.86 mL/min (ref >60.00)
Glucose, Bld: 105 mg/dL — ABNORMAL HIGH (ref 70–99)
Potassium: 4.4 meq/L (ref 3.5–5.1)
Sodium: 139 meq/L (ref 135–145)
Total Bilirubin: 0.6 mg/dL (ref 0.2–1.2)
Total Protein: 6.9 g/dL (ref 6.0–8.3)

## 2024-07-01 LAB — CBC WITH DIFFERENTIAL/PLATELET
Basophils Absolute: 0 K/uL (ref 0.0–0.1)
Basophils Relative: 0.5 % (ref 0.0–3.0)
Eosinophils Absolute: 0.1 K/uL (ref 0.0–0.7)
Eosinophils Relative: 2.1 % (ref 0.0–5.0)
HCT: 43 % (ref 39.0–52.0)
Hemoglobin: 14.1 g/dL (ref 13.0–17.0)
Lymphocytes Relative: 25.8 % (ref 12.0–46.0)
Lymphs Abs: 0.9 K/uL (ref 0.7–4.0)
MCHC: 32.8 g/dL (ref 30.0–36.0)
MCV: 94.8 fl (ref 78.0–100.0)
Monocytes Absolute: 0.4 K/uL (ref 0.1–1.0)
Monocytes Relative: 10.3 % (ref 3.0–12.0)
Neutro Abs: 2.2 K/uL (ref 1.4–7.7)
Neutrophils Relative %: 61.3 % (ref 43.0–77.0)
Platelets: 165 K/uL (ref 150.0–400.0)
RBC: 4.53 Mil/uL (ref 4.22–5.81)
RDW: 14.2 % (ref 11.5–15.5)
WBC: 3.6 K/uL — ABNORMAL LOW (ref 4.0–10.5)

## 2024-07-01 LAB — TSH: TSH: 0.91 u[IU]/mL (ref 0.35–5.50)

## 2024-07-01 MED ORDER — FLUTICASONE PROPIONATE 50 MCG/ACT NA SUSP: 1.0000 | Freq: Every day | NASAL | 6 refills | Status: DC

## 2024-07-01 NOTE — Progress Notes (Signed)
 Subjective  Chief Complaint  Patient presents with   Annual Exam   Hypertension    HPI: Derek Brown is a 68 y.o. male who presents to [facility] [place] today for a Male Wellness Visit. He also has the concerns and/or needs as listed above in the chief complaint. These will be addressed in addition to the Health Maintenance Visit.   Wellness Visit: annual visit with health maintenance review and exam   HM: Colon cancer screening up to date.  Healthy lifestyle.  Full-time.  Doing well overall.  Eye exam current.  Exercises regularly. Eligible for Shingrix, Prevnar 20 and flu vaccination. Reviewed recent urology notes and pulmonology records  Body mass index is 25.12 kg/m. Wt Readings from Last 3 Encounters:  07/01/24 185 lb 3.2 oz (84 kg)  03/05/24 185 lb (83.9 kg)  02/18/24 185 lb 9.6 oz (84.2 kg)   Chronic disease management visit and/or acute problem visit: Discussed the use of AI scribe software for clinical note transcription with the patient, who gave verbal consent to proceed.  History of Present Illness Derek Brown is a 68 year old male with sleep apnea who presents for follow-up on his sleep apnea management and blood pressure control.  He is currently undergoing evaluation for sleep apnea and has an overnight sleep study scheduled for September 11th. He previously had an at-home sleep study, and he is hoping the results will support the need for further treatment. He is concerned about the process of obtaining a CPAP machine, as it was taken away in May.  He has made changes in his blood pressure management, having stopped taking carvedilol, which has helped stabilize his blood pressure. He currently takes losartan . He monitors his blood pressure at home, noting readings typically in the 120s/70s, occasionally dropping to the 110s/60s. He experiences higher readings, up to 130s, when stressed or not exercising, but rarely sees readings in the 140s. No  headaches, but he reports occasional dizziness when standing up after lying down for a while.  He has a history of allergies and has tried Zyrtec and Allegra, which provide some relief. He has a prescription nasal spray, possibly Flonase  or Nasonex, which he uses sporadically when sick but not regularly.  He has received a pneumonia vaccination and mentions that he received a COVID booster last year. 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   Assessment  1. Encounter for well adult exam with abnormal findings   2. Essential hypertension   3. Follicular lymphoma grade II of lymph nodes of head, face, and neck (HCC)   4. Sinus bradycardia   5. OSA (obstructive sleep apnea)   6. Benign nodular prostatic hyperplasia with lower urinary tract symptoms   7. Need for hepatitis C screening test      Plan  Male Wellness Visit: Age appropriate Health Maintenance and Prevention measures were discussed with patient. Included topics are cancer screening recommendations, ways to keep healthy (see AVS) including dietary and exercise recommendations, regular eye and dental care, use of seat belts, and avoidance of moderate alcohol use and tobacco use.  Screens are current.  Screen hepatitis C today BMI: discussed patient's BMI and encouraged positive lifestyle modifications to help get to or maintain a target BMI. HM needs and immunizations were addressed and ordered. See below for orders. See HM and immunization section for updates. Routine labs and screening tests ordered including cmp, cbc and lipids where appropriate. Discussed recommendations regarding  Vit D and calcium supplementation (see AVS)  Chronic disease f/u and/or acute problem visit: (deemed necessary to be done in addition to the wellness visit): Assessment and Plan Assessment & Plan Obstructive sleep apnea Undergoing further evaluation. Previous at-home study showed severe apnea. Concerns about CPAP therapy  process. - Proceed with overnight sleep study on September 11th for evaluation and potential CPAP therapy.  Essential hypertension Managed with losartan . Discontinuation of carvedilol improved bradycardia and symptoms of hypotension. Home readings within target range. Occasional postural dizziness reported. - Continue losartan .  100 mg daily, check renal function electrolytes and lipids today - Schedule appointment if blood pressure control becomes a concern.  Allergic rhinitis Symptoms partially controlled with oral antihistamines. Prescription nasal spray not used regularly. - Use nasal spray (Flonase  or Nasonex) regularly, especially during fall. - Continue oral antihistamine (Zyrtec or Allegra) based on preference. - Send prescription for Flonase  to pharmacy.  BPH is stable with normal recent PSA and urology follow-up  History of follicular lymphoma.  Monitoring CBC, no lymphadenopathy  Sinus bradycardia stable.  Recommend avoiding beta-blockers   Follow up: 1 year for complete physical and follow-up blood pressure Commons side effects, risks, benefits, and alternatives for medications and treatment plan prescribed today were discussed, and the patient expressed understanding of the given instructions. Patient is instructed to call or message via MyChart if he/she has any questions or concerns regarding our treatment plan. No barriers to understanding were identified. We discussed Red Flag symptoms and signs in detail. Patient expressed understanding regarding what to do in case of urgent or emergency type symptoms.  Medication list was reconciled, printed and provided to the patient in AVS. Patient instructions and summary information was reviewed with the patient as documented in the AVS. This note was prepared with assistance of Dragon voice recognition software. Occasional wrong-word or sound-a-like substitutions may have occurred due to the inherent limitations of voice recognition  software  Orders Placed This Encounter  Procedures   CBC with Differential/Platelet   Comprehensive metabolic panel with GFR   Lipid panel   TSH   Hepatitis C antibody   No orders of the defined types were placed in this encounter.    Patient Active Problem List   Diagnosis Date Noted   Essential hypertension 02/18/2024   Elevated prostate specific antigen (PSA) 07/23/2016   Benign nodular prostatic hyperplasia with lower urinary tract symptoms 07/26/2014   Other malignant lymphomas, unspecified site, extranodal and solid organ sites 01/15/2014   Follicular lymphoma grade II of lymph nodes of head, face, and neck (HCC) 07/19/2012   History of non-Hodgkin's lymphoma 04/04/2010   History of subdural hematoma 02/18/2024   History of GI diverticular bleed 02/18/2024   Sinus bradycardia 12/26/2016   Scrotal varices 07/26/2014   Allergic rhinitis 04/04/2010   History of alcoholism (HCC) 02/21/2009   OSA (obstructive sleep apnea) 03/05/2024   Health Maintenance  Topic Date Due   Medicare Annual Wellness (AWV)  Never done   Hepatitis C Screening  Never done   Zoster Vaccines- Shingrix (1 of 2) 11/15/1974   Pneumococcal Vaccine: 50+ Years (2 of 2 - PCV) 11/02/2016   INFLUENZA VACCINE  06/26/2024   COVID-19 Vaccine (5 - 2024-25 season) 07/17/2024 (Originally 07/28/2023)   DTaP/Tdap/Td (3 - Td or Tdap) 02/04/2028   Colonoscopy  02/24/2033   Hepatitis B Vaccines  Aged Out   HPV VACCINES  Aged Out   Meningococcal B Vaccine  Aged Out   Immunization History  Administered Date(s) Administered  Fluad Quad(high Dose 65+) 09/08/2016   Influenza Inj Mdck Quad Pf 09/05/2017, 07/14/2018   Influenza Split 08/26/2012, 08/06/2013   Influenza, Seasonal, Injecte, Preservative Fre 11/03/2015   Influenza,inj,Quad PF,6+ Mos 08/29/2014, 11/03/2015, 09/08/2016   Influenza,trivalent, recombinat, inj, PF 08/26/2012, 08/06/2013   Influenza-Unspecified 07/27/2018, 07/27/2022   PFIZER(Purple  Top)SARS-COV-2 Vaccination 02/01/2020, 02/24/2020, 08/09/2020   Pfizer Covid-19 Vaccine Bivalent Booster 61yrs & up 08/25/2022   Pneumococcal Polysaccharide-23 11/03/2015   Respiratory Syncytial Virus Vaccine,Recomb Aduvanted(Arexvy) 09/12/2022   Tdap 12/14/2015, 02/03/2018   Zoster, Live 12/26/2016   We updated and reviewed the patient's past history in detail and it is documented below. Allergies: Patient has no known allergies. Past Medical History  has a past medical history of Cataract, Hypertension, Non-Hodgkin lymphoma (HCC), Right inguinal hernia (07/26/2014), and Sleep apnea. Past Surgical History Patient  has a past surgical history that includes Hernia repair. Social History Patient  reports that he quit smoking about 46 years ago. His smoking use included cigarettes. He started smoking about 51 years ago. He has a 10 pack-year smoking history. He has never used smokeless tobacco. He reports that he does not drink alcohol and does not use drugs. Family History family history includes Breast cancer in his mother; Depression in his maternal grandfather; Heart disease (age of onset: 45) in his father; Hypertension in his brother and father. Review of Systems: Constitutional: negative for fever or malaise Ophthalmic: negative for photophobia, double vision or loss of vision Cardiovascular: negative for chest pain, dyspnea on exertion, or new LE swelling Respiratory: negative for SOB or persistent cough Gastrointestinal: negative for abdominal pain, change in bowel habits or melena Genitourinary: negative for dysuria or gross hematuria Musculoskeletal: negative for new gait disturbance or muscular weakness Integumentary: negative for new or persistent rashes Neurological: negative for TIA or stroke symptoms Psychiatric: negative for SI or delusions Allergic/Immunologic: negative for hives  Patient Care Team    Relationship Specialty Notifications Start End  Jodie Lavern CROME, MD  PCP - General Family Medicine  02/18/24   Nicholaus Tanda CROME DOUGLAS, MD Consulting Physician Urology  02/18/24   Cloretta Arley NOVAK, MD Consulting Physician Oncology  02/18/24   Vernetta Berg, MD Consulting Physician General Surgery  02/18/24   Silva Juliene SAUNDERS, DPM Consulting Physician Podiatry  02/18/24    Objective  Vitals: BP (!) 145/74   Pulse (!) 55   Temp 97.8 F (36.6 C)   Ht 6' (1.829 m)   Wt 185 lb 3.2 oz (84 kg)   SpO2 97%   BMI 25.12 kg/m  General:  Well developed, well nourished, no acute distress  Psych:  Alert and orientedx3,normal mood and affect HEENT:  Normocephalic, atraumatic, non-icteric sclera,  oropharynx is clear without mass or exudate, supple neck without adenopathy, or thyromegaly Cardiovascular:  Normal S1, S2, RRR without gallop, rub or murmur,  Respiratory:  Good breath sounds bilaterally, CTAB with normal respiratory effort Gastrointestinal: normal bowel sounds, soft, non-tender, no noted masses. No HSM MSK: Joints are without erythema or swelling.  Skin:  Warm, no rashes Neurologic:    Mental status is normal.  Gross motor and sensory exams are normal. Stable gait. No tremor

## 2024-07-01 NOTE — Patient Instructions (Signed)
 Please return in 12 months for your annual complete physical; please come fasting.   I will release your lab results to you on your MyChart account with further instructions. You may see the results before I do, but when I review them I will send you a message with my report or have my assistant call you if things need to be discussed. Please reply to my message with any questions. Thank you!   If you have any questions or concerns, please don't hesitate to send me a message via MyChart or call the office at [phone]. Thank you for visiting with us  today! It's our pleasure caring for you.    VISIT SUMMARY: You came in today for a follow-up on your sleep apnea management and blood pressure control. We discussed your upcoming overnight sleep study and your concerns about obtaining a CPAP machine. We also reviewed your current blood pressure management and allergy symptoms.  YOUR PLAN: -OBSTRUCTIVE SLEEP APNEA: Obstructive sleep apnea is a condition where your breathing stops and starts repeatedly during sleep. You will proceed with your overnight sleep study on September 11th to evaluate the severity and determine if CPAP therapy is needed.  -ESSENTIAL HYPERTENSION: Essential hypertension is high blood pressure with no identifiable cause. Your blood pressure is currently well-managed with losartan . Continue taking losartan  and monitor your blood pressure at home. Schedule an appointment if you notice any concerns with your blood pressure control.  -ALLERGIC RHINITIS: Allergic rhinitis is an allergic reaction that causes sneezing, congestion, and a runny nose. Use your nasal spray (Flonase  or Nasonex) regularly, especially during the fall season, and continue taking an oral antihistamine like Zyrtec or Allegra based on your preference. A prescription for Flonase  has been sent to your pharmacy.  INSTRUCTIONS: Proceed with your overnight sleep study on September 11th. Continue monitoring your blood  pressure at home and schedule an appointment if you have any concerns. Use your nasal spray regularly and continue with your preferred oral antihistamine.                      Contains text generated by [employer].                                 Contains text generated by [employer].

## 2024-07-02 LAB — HEPATITIS C ANTIBODY: Hepatitis C Ab: NONREACTIVE

## 2024-07-12 ENCOUNTER — Ambulatory Visit: Payer: Self-pay | Admitting: Family Medicine

## 2024-07-12 NOTE — Progress Notes (Signed)
 Labs reviewed.  The 10-year ASCVD risk score (Arnett DK, et al., 2019) is: 12.2%   Values used to calculate the score:     Age: 68 years     Clincally relevant sex: Male     Is Non-Hispanic African American: No     Diabetic: No     Tobacco smoker: No     Systolic Blood Pressure: 116 mmHg     Is BP treated: Yes     HDL Cholesterol: 77.5 mg/dL     Total Cholesterol: 189 mg/dL

## 2024-07-29 ENCOUNTER — Ambulatory Visit (INDEPENDENT_AMBULATORY_CARE_PROVIDER_SITE_OTHER): Admitting: Family Medicine

## 2024-07-29 ENCOUNTER — Encounter: Payer: Self-pay | Admitting: Family Medicine

## 2024-07-29 VITALS — BP 120/70 | HR 52 | Temp 97.7°F | Ht 72.0 in | Wt 187.0 lb

## 2024-07-29 DIAGNOSIS — M5431 Sciatica, right side: Secondary | ICD-10-CM

## 2024-07-29 NOTE — Progress Notes (Signed)
 Subjective  CC:  Chief Complaint  Patient presents with   Hip Pain    Pt stated that about 3 weeks ago he was doing some yard work and thinks he pulled a muscle. He sated that it has gotten a little better since then.    HPI: Derek Brown is a 68 y.o. male who presents to the office today to address the problems listed above in the chief complaint. Discussed the use of AI scribe software for clinical note transcription with the patient, who gave verbal consent to proceed.  History of Present Illness Derek Brown is a 68 year old male who presents with right hip and leg pain.  Approximately three weeks ago, he experienced the onset of pain in his right hip and leg after performing yard work, specifically pulling ivy. The pain radiates from the hip area, primarily in the back, and sometimes extends down the leg. Initially, he assumed he had pulled something, but the pain worsened over the weekend after a six-hour car trip.  The pain is exacerbated by activities such as driving, going up steps, and walking when off balance. However, there is no pain when sitting or riding his bicycle, noting that the pedaling motion seems to help. He has been limping due to the pain and has been taking ibuprofen, two pills twice a day over the weekend, to manage the discomfort. He did not take any ibuprofen on the morning of the visit.  He has a history of a hernia repair approximately thirty years ago, which occasionally resulted in similar pain if overworked. He recalls that the pain would improve over time, but this recent episode has persisted for three weeks, prompting concern. No bowel or bladder issues and no pain radiating to the knee.  In terms of social history, he used to perform regular exercises but has since stopped, which he believes may contribute to his current condition. His wife has noted his pattern of inactivity followed by intense physical activity, which she refers to as 'weekend  warrior' behavior.   Assessment  1. Right sided sciatica      Plan  Assessment and Plan Assessment & Plan Right-sided sciatica and low back pain Pain likely due to inflammation and nerve impingement, exacerbated by prolonged sitting. No hip damage or muscular injury. Possible underlying arthritis. - Continue ibuprofen, 2-3 pills twice daily with food for 1-2 weeks. - Initiate gentle stretching exercises. - Consider prescription anti-inflammatory if ibuprofen not tolerated. - Consider short course of prednisone if no improvement. - Encourage low back core exercises. See avs    Follow up: prn No orders of the defined types were placed in this encounter.  No orders of the defined types were placed in this encounter.    I reviewed the patients updated PMH, FH, and SocHx.  Patient Active Problem List   Diagnosis Date Noted   White coat syndrome with diagnosis of hypertension 02/18/2024    Priority: High   Elevated prostate specific antigen (PSA) 07/23/2016    Priority: High   Benign nodular prostatic hyperplasia with lower urinary tract symptoms 07/26/2014    Priority: High   Other malignant lymphomas, unspecified site, extranodal and solid organ sites 01/15/2014    Priority: High   Follicular lymphoma grade II of lymph nodes of head, face, and neck (HCC) 07/19/2012    Priority: High   History of non-Hodgkin's lymphoma 04/04/2010    Priority: High   History of subdural hematoma 02/18/2024    Priority:  Medium    History of GI diverticular bleed 02/18/2024    Priority: Medium    Sinus bradycardia 12/26/2016    Priority: Medium    Scrotal varices 07/26/2014    Priority: Low   Allergic rhinitis 04/04/2010    Priority: Low   History of alcoholism (HCC) 02/21/2009    Priority: Low   OSA (obstructive sleep apnea) 03/05/2024   Current Meds  Medication Sig   fluticasone  (FLONASE ) 50 MCG/ACT nasal spray Place 1 spray into both nostrils daily.   losartan  (COZAAR ) 100 MG  tablet Take 1 tablet (100 mg total) by mouth at bedtime.   Allergies: Patient has no known allergies. Family History: Patient family history includes Breast cancer in his mother; Depression in his maternal grandfather; Heart disease (age of onset: 45) in his father; Hypertension in his brother and father. Social History:  Patient  reports that he quit smoking about 46 years ago. His smoking use included cigarettes. He started smoking about 51 years ago. He has a 10 pack-year smoking history. He has never used smokeless tobacco. He reports that he does not drink alcohol and does not use drugs.  Review of Systems: Constitutional: Negative for fever malaise or anorexia Cardiovascular: negative for chest pain Respiratory: negative for SOB or persistent cough Gastrointestinal: negative for abdominal pain  Objective  Vitals: BP 120/70 Comment: home readings  Pulse (!) 52   Temp 97.7 F (36.5 C)   Ht 6' (1.829 m)   Wt 187 lb (84.8 kg)   SpO2 96%   BMI 25.36 kg/m  General: no acute distress , A&Ox3 Back: Full range of ocean, no SI joint tenderness, no muscular tenderness, no sciatic notch tenderness, no hip bursitis tenderness.  Negative straight leg raise bilaterally.  Normal gait.  Normal strength throughout bilateral lower extremities Commons side effects, risks, benefits, and alternatives for medications and treatment plan prescribed today were discussed, and the patient expressed understanding of the given instructions. Patient is instructed to call or message via MyChart if he/she has any questions or concerns regarding our treatment plan. No barriers to understanding were identified. We discussed Red Flag symptoms and signs in detail. Patient expressed understanding regarding what to do in case of urgent or emergency type symptoms.  Medication list was reconciled, printed and provided to the patient in AVS. Patient instructions and summary information was reviewed with the patient as  documented in the AVS. This note was prepared with assistance of Dragon voice recognition software. Occasional wrong-word or sound-a-like substitutions may have occurred due to the inherent limitations of voice recognition software

## 2024-07-29 NOTE — Patient Instructions (Signed)
 Please follow up if symptoms do not improve or as needed.    Back Exercises The following exercises strengthen the muscles that help to support the trunk (torso) and back. They also help to keep the lower back flexible. Doing these exercises can help to prevent or lessen existing low back pain. If you have back pain or discomfort, try doing these exercises 2-3 times each day or as told by your health care provider. As your pain improves, do them once each day, but increase the number of times that you repeat the steps for each exercise (do more repetitions). To prevent the recurrence of back pain, continue to do these exercises once each day or as told by your health care provider. Do exercises exactly as told by your health care provider and adjust them as directed. It is normal to feel mild stretching, pulling, tightness, or discomfort as you do these exercises, but you should stop right away if you feel sudden pain or your pain gets worse. Exercises Single knee to chest Repeat these steps 3-5 times for each leg: Lie on your back on a firm bed or the floor with your legs extended. Bring one knee to your chest. Your other leg should stay extended and in contact with the floor. Hold your knee in place by grabbing your knee or thigh with both hands and hold. Pull on your knee until you feel a gentle stretch in your lower back or buttocks. Hold the stretch for 10-30 seconds. Slowly release and straighten your leg.  Pelvic tilt Repeat these steps 5-10 times: Lie on your back on a firm bed or the floor with your legs extended. Bend your knees so they are pointing toward the ceiling and your feet are flat on the floor. Tighten your lower abdominal muscles to press your lower back against the floor. This motion will tilt your pelvis so your tailbone points up toward the ceiling instead of pointing to your feet or the floor. With gentle tension and even breathing, hold this position for 5-10  seconds.  Cat-cow Repeat these steps until your lower back becomes more flexible: Get into a hands-and-knees position on a firm bed or the floor. Keep your hands under your shoulders, and keep your knees under your hips. You may place padding under your knees for comfort. Let your head hang down toward your chest. Contract your abdominal muscles and point your tailbone toward the floor so your lower back becomes rounded like the back of a cat. Hold this position for 5 seconds. Slowly lift your head, let your abdominal muscles relax, and point your tailbone up toward the ceiling so your back forms a sagging arch like the back of a cow. Hold this position for 5 seconds.  Press-ups Repeat these steps 5-10 times: Lie on your abdomen (face-down) on a firm bed or the floor. Place your palms near your head, about shoulder-width apart. Keeping your back as relaxed as possible and keeping your hips on the floor, slowly straighten your arms to raise the top half of your body and lift your shoulders. Do not use your back muscles to raise your upper torso. You may adjust the placement of your hands to make yourself more comfortable. Hold this position for 5 seconds while you keep your back relaxed. Slowly return to lying flat on the floor.  Bridges Repeat these steps 10 times: Lie on your back on a firm bed or the floor. Bend your knees so they are pointing toward the  ceiling and your feet are flat on the floor. Your arms should be flat at your sides, next to your body. Tighten your buttocks muscles and lift your buttocks off the floor until your waist is at almost the same height as your knees. You should feel the muscles working in your buttocks and the back of your thighs. If you do not feel these muscles, slide your feet 1-2 inches (2.5-5 cm) farther away from your buttocks. Hold this position for 3-5 seconds. Slowly lower your hips to the starting position, and allow your buttocks muscles to relax  completely. If this exercise is too easy, try doing it with your arms crossed over your chest. Abdominal crunches Repeat these steps 5-10 times: Lie on your back on a firm bed or the floor with your legs extended. Bend your knees so they are pointing toward the ceiling and your feet are flat on the floor. Cross your arms over your chest. Tip your chin slightly toward your chest without bending your neck. Tighten your abdominal muscles and slowly raise your torso high enough to lift your shoulder blades a tiny bit off the floor. Avoid raising your torso higher than that because it can put too much stress on your lower back and does not help to strengthen your abdominal muscles. Slowly return to your starting position.  Back lifts Repeat these steps 5-10 times: Lie on your abdomen (face-down) with your arms at your sides, and rest your forehead on the floor. Tighten the muscles in your legs and your buttocks. Slowly lift your chest off the floor while you keep your hips pressed to the floor. Keep the back of your head in line with the curve in your back. Your eyes should be looking at the floor. Hold this position for 3-5 seconds. Slowly return to your starting position.  Contact a health care provider if: Your back pain or discomfort gets much worse when you do an exercise. Your worsening back pain or discomfort does not lessen within 2 hours after you exercise. If you have any of these problems, stop doing these exercises right away. Do not do them again unless your health care provider says that you can. Get help right away if: You develop sudden, severe back pain. If this happens, stop doing the exercises right away. Do not do them again unless your health care provider says that you can. This information is not intended to replace advice given to you by your health care provider. Make sure you discuss any questions you have with your health care provider. Document Revised: 12/16/2022  Document Reviewed: 01/25/2021 Elsevier Patient Education  2024 [employer]  Sciatica  Sciatica is pain, numbness, weakness, or tingling along the path of the sciatic nerve. The sciatic nerve starts in the lower back and runs down the back of each leg. The nerve controls the muscles in the lower leg and in the back of the knee. It also provides feeling (sensation) to the back of the thigh, the lower leg, and the sole of the foot. Sciatica is a symptom of another medical condition that pinches or puts pressure on the sciatic nerve. Sciatica most often only affects one side of the body. Sciatica usually goes away on its own or with treatment. In some cases, sciatica may come back (recur). What are the causes? This condition is caused by pressure on the sciatic nerve or pinching of the nerve. This may be the result of: A disk in between the bones of the  spine bulging out too far (herniated disk). Age-related changes in the spinal disks. A pain disorder that affects a muscle in the buttock. Extra bone growth near the sciatic nerve. A break (fracture) of the pelvis. Pregnancy. Tumor. This is rare. What increases the risk? The following factors may make you more likely to develop this condition: Playing sports that place pressure or stress on the spine. Having poor strength and flexibility. A history of back injury or surgery. Sitting for long periods of time. Doing activities that involve repetitive bending or lifting. Obesity. What are the signs or symptoms? Symptoms can vary from mild to very severe. They may include: Any of the following problems in the lower back, leg, hip, or buttock: Mild tingling, numbness, or dull aches. Burning sensations. Sharp pains. Numbness in the back of the calf or the sole of the foot. Leg weakness. Severe back pain that makes movement difficult. Symptoms may get worse when you cough, sneeze, or laugh, or when you sit or stand for long periods of  time. How is this diagnosed? This condition may be diagnosed based on: Your symptoms and medical history. A physical exam. Blood tests. Imaging tests, such as: X-rays. An MRI. A CT scan. How is this treated? In many cases, this condition improves on its own without treatment. However, treatment may include: Reducing or modifying physical activity. Exercising, including strengthening and stretching. Icing and applying heat to the affected area. Medicines that help to: Relieve pain and swelling. Relax your muscles. Injections of medicines that help to relieve pain and inflammation (steroids) around the sciatic nerve. Surgery. Follow these instructions at home: Medicines Take over-the-counter and prescription medicines only as told by your health care provider. Ask your health care provider if the medicine prescribed to you requires you to avoid driving or using heavy machinery. Managing pain     If directed, put ice on the affected area. To do this: Put ice in a plastic bag. Place a towel between your skin and the bag. Leave the ice on for 20 minutes, 2-3 times a day. If your skin turns bright red, remove the ice right away to prevent skin damage. The risk of skin damage is higher if you cannot feel pain, heat, or cold. If directed, apply heat to the affected area as often as told by your health care provider. Use the heat source that your health care provider recommends, such as a moist heat pack or a heating pad. Place a towel between your skin and the heat source. Leave the heat on for 20-30 minutes. If your skin turns bright red, remove the heat right away to prevent burns. The risk of burns is higher if you cannot feel pain, heat, or cold. Activity  Return to your normal activities as told by your health care provider. Ask your health care provider what activities are safe for you. Avoid activities that make your symptoms worse. Take brief periods of rest throughout the  day. When you rest for longer periods, mix in some mild activity or stretching between periods of rest. This will help to prevent stiffness and pain. Avoid sitting for long periods of time without moving. Get up and move around at least one time each hour. Exercise and stretch regularly as told by your health care provider. Do not lift anything that is heavier than 10 lb (4.5 kg) until your health care provider says that it is safe. When you do not have symptoms, you should still avoid heavy lifting, especially repetitive  heavy lifting. When you lift objects, always use proper lifting technique, which includes: Bending your knees. Keeping the load close to your body. Avoiding twisting. General instructions Maintain a healthy weight. Excess weight puts extra stress on your back. Wear supportive, comfortable shoes. Avoid wearing high heels. Avoid sleeping on a mattress that is too soft or too hard. A mattress that is firm enough to support your back when you sleep may help to reduce your pain. Contact a health care provider if: Your pain is not controlled by medicine. Your pain does not improve or gets worse. Your pain lasts longer than 4 weeks. You have unexplained weight loss. Get help right away if: You are not able to control when you urinate or have bowel movements (incontinence). You have: Weakness in your lower back, pelvis, buttocks, or legs that gets worse. Redness or swelling of your back. A burning sensation when you urinate. Summary Sciatica is pain, numbness, weakness, or tingling along the path of the sciatic nerve, which may include the lower back, legs, hips, and buttocks. This condition is caused by pressure on the sciatic nerve or pinching of the nerve. Treatment often includes rest, exercise, medicines, and applying ice or heat. This information is not intended to replace advice given to you by your health care provider. Make sure you discuss any questions you have with  your health care provider. Document Revised: 02/19/2022 Document Reviewed: 02/19/2022 Elsevier Patient Education  2024 [employer].

## 2024-08-06 ENCOUNTER — Ambulatory Visit (HOSPITAL_BASED_OUTPATIENT_CLINIC_OR_DEPARTMENT_OTHER): Attending: Adult Health | Admitting: Pulmonary Disease

## 2024-08-06 DIAGNOSIS — G4733 Obstructive sleep apnea (adult) (pediatric): Secondary | ICD-10-CM

## 2024-08-08 NOTE — Procedures (Signed)
 [facility] [address] [place], [place] [address] Tel: [phone]   Fax: [phone]  Titration Interpretation  Patient [name], NIETO Date:  08/06/2024 Referring Physician:  MADELIN PARRETT [phone]) %%startinterp%% Indications for Polysomnography The patient is a 68 year-old Male who is 6' and weighs 187.0 lbs. His BMI equals 25.6.  A full night titration treatment study was performed.   Polysomnogram Data A full night polysomnogram recorded the standard physiologic parameters including EEG, EOG, EMG, EKG, nasal and oral airflow.  Respiratory parameters of chest and abdominal movements were recorded with Respiratory Inductance Plethysmography belts.  Oxygen saturation was recorded by pulse oximetry.   Sleep Architecture The total recording time of the polysomnogram was 383.2 minutes.  The total sleep time was 290.5 minutes.  The patient spent 1.0% of total sleep time in Stage N1, 92.8% in Stage N2, 0.2% in Stages N3, and 6.0% in REM.  Sleep latency was 8.5 minutes.  REM latency was 239.0 minutes.  Sleep Efficiency was 75.8%.  Wake after Sleep Onset time was 84.5 minutes.  Titration Summary The patient was titrated at pressures ranging from 5* cm/H20 up to 20/16/0** cm/H20.  The last pressure used in the study was 20/16/0** cm/H20 without supplemental oxygen.  Respiratory Events The polysomnogram revealed a presence of 4 obstructive, 20 central, and - mixed apneas resulting in an Apnea index of 5.0 events per hour.  There were 112 hypopneas (>=3% desaturation and/or arousal) resulting in an Apnea\Hypopnea Index (AHI >=3% desaturation and/or arousal) of 28.1 events per hour.  There were 69 hypopneas (>=4% desaturation) resulting in an Apnea\Hypopnea Index (AHI >=4% desaturation) of 19.2 events per hour.  There were 14 Respiratory Effort Related Arousals resulting in a RERA index of 2.9 events per hour. The Respiratory Disturbance Index is 31.0  events per hour.  The snore index was 210.5 events per hour.  Mean oxygen saturation was 94.1%.  The lowest oxygen saturation during sleep was 84.0%.  Time spent <=88% oxygen saturation was 5.5 minutes (1.5%).  Limb Activity There were 30 limb movements recorded.  Of this total, - were classified as PLMs.  Of the PLMs, - were associated with arousals.  The Limb Movement index was 6.2 per hour while the PLM index was - per hour.  Cardiac Summary The average pulse rate was 48.1 bpm.  The minimum pulse rate was 40.0 bpm while the maximum pulse rate was 69.0 bpm.  Cardiac rhythm was normal/abnormal.  Comments: CPAP was used upto 11cm. He was switched to bilevel due to central apneas & persistent hypopneas & inability to tolerate high pressures on CPAP. No oxygen was required  Diagnosis: Severe OSA corrected by BiPAP 20/16 cm  Recommendations: trial of BiPAP 20/16 cm with large full face mask Preferably, use autobilevel PS +4, EPAP 10, IPAP max 20 Evaluate pressure tolerance & download data at this level & fine tune accordingly   This study was personally reviewed and electronically signed by: HARDEN STAFF MD Accredited Board Certified in Sleep Medicine

## 2024-08-13 ENCOUNTER — Ambulatory Visit: Payer: Self-pay | Admitting: Adult Health

## 2024-08-24 NOTE — Telephone Encounter (Signed)
 Copied from CRM #[id number]. Topic: Clinical - Lab/Test Results >> Aug 20, 2024  3:22 PM Isabell A wrote: Reason for CRM: Patient returning phone call from [facility] in regard to sleep study.   Callback number: [phone]  Called the pt and there was no answer- LMTCB

## 2024-08-25 ENCOUNTER — Other Ambulatory Visit: Payer: Self-pay | Admitting: *Deleted

## 2024-08-25 ENCOUNTER — Telehealth: Payer: Self-pay

## 2024-08-25 DIAGNOSIS — G4733 Obstructive sleep apnea (adult) (pediatric): Secondary | ICD-10-CM

## 2024-08-25 NOTE — Telephone Encounter (Signed)
 ATC, left detailed VM with results per DPR.  Advised to call back with any questions.  Mychart message sent as well with results.  Order for Bipap sent.

## 2024-08-25 NOTE — Telephone Encounter (Signed)
 Copied from CRM #[id number]. Topic: Clinical - Lab/Test Results >> Aug 20, 2024  3:22 PM Isabell A wrote: Reason for CRM: Patient returning phone call from [facility] in regard to sleep study.   Callback number: [phone] >> Aug 24, 2024  4:28 PM Chantha C wrote: Patient is returning the office call, unsure, he think it's sleep study results. Please call back [phone].    Tried to reach out to patient VM/ LM to return call to clinic for results

## 2024-08-26 ENCOUNTER — Ambulatory Visit (INDEPENDENT_AMBULATORY_CARE_PROVIDER_SITE_OTHER)

## 2024-08-26 ENCOUNTER — Telehealth: Payer: Self-pay

## 2024-08-26 ENCOUNTER — Other Ambulatory Visit: Payer: Self-pay

## 2024-08-26 VITALS — Ht 72.0 in | Wt 185.0 lb

## 2024-08-26 DIAGNOSIS — Z0000 Encounter for general adult medical examination without abnormal findings: Secondary | ICD-10-CM

## 2024-08-26 DIAGNOSIS — G4733 Obstructive sleep apnea (adult) (pediatric): Secondary | ICD-10-CM

## 2024-08-26 NOTE — Progress Notes (Signed)
 Subjective:   Derek Brown is a 68 y.o. who presents for a Medicare Wellness preventive visit.  As a reminder, Annual Wellness Visits don't include a physical exam, and some assessments may be limited, especially if this visit is performed virtually. We may recommend an in-person follow-up visit with your provider if needed.  Visit Complete: Virtual I connected with  Derek Brown on 08/26/24 by a audio enabled telemedicine application and verified that I am speaking with the correct person using two identifiers.  Patient Location: Home  Provider Location: Home Office  I discussed the limitations of evaluation and management by telemedicine. The patient expressed understanding and agreed to proceed.  Vital Signs: Because this visit was a virtual/telehealth visit, some criteria may be missing or patient reported. Any vitals not documented were not able to be obtained and vitals that have been documented are patient reported.  VideoDeclined- This patient declined Librarian, academic. Therefore the visit was completed with audio only.  Persons Participating in Visit: Patient.  AWV Questionnaire: Yes: Patient Medicare AWV questionnaire was completed by the patient on 08/22/24; I have confirmed that all information answered by patient is correct and no changes since this date.  Cardiac Risk Factors include: hypertension;advanced age (>7men, >41 women);male gender     Objective:    Today's Vitals   08/26/24 1342  Weight: 185 lb (83.9 kg)  Height: 6' (1.829 m)   Body mass index is 25.09 kg/m.     08/26/2024    1:45 PM 09/27/2023    8:22 AM 09/26/2022    8:39 AM 09/29/2020    8:11 AM 01/22/2019    8:05 AM 01/22/2017   11:41 AM 09/20/2016   11:54 AM  Advanced Directives  Does Patient Have a Medical Advance Directive? Yes No Yes Yes Yes  Yes  No   Type of Estate agent of [place];Living will  Healthcare Power of [place];Living will  Healthcare Power of [place];Living will Healthcare Power of [place];Living will Living will   Does patient want to make changes to medical advance directive?   No - Patient declined No - Patient declined No - Patient declined     Copy of Healthcare Power of Attorney in Chart? No - copy requested  No - copy requested No - copy requested No - copy requested     Would patient like information on creating a medical advance directive?  No - Patient declined   No - Patient declined   No - patient declined information      Data saved with a previous flowsheet row definition    Current Medications (verified) Outpatient Encounter Medications as of 08/26/2024  Medication Sig   fluticasone  (FLONASE ) 50 MCG/ACT nasal spray Place 1 spray into both nostrils daily.   losartan  (COZAAR ) 100 MG tablet Take 1 tablet (100 mg total) by mouth at bedtime.   Multiple Vitamin (MULTIVITAMIN) tablet Take 1 tablet by mouth daily.   No facility-administered encounter medications on file as of 08/26/2024.    Allergies (verified) Patient has no known allergies.   History: Past Medical History:  Diagnosis Date   Cataract    Hypertension    Non-Hodgkin lymphoma (HCC)    Right inguinal hernia 07/26/2014   Sleep apnea    Past Surgical History:  Procedure Laterality Date   HERNIA REPAIR     Family History  Problem Relation Age of Onset   Breast cancer Mother    Heart disease Father 37  passed MI   Hypertension Father    Hypertension Brother    Depression Maternal Grandfather    Stroke Neg Hx    Diabetes Neg Hx    Dementia Neg Hx    Social History   Socioeconomic History   Marital status: Married    Spouse name: Not on file   Number of children: 1   Years of education: Not on file   Highest education level: Some college, no degree  Occupational History   Not on file  Tobacco Use   Smoking status: Former    Current packs/day: 0.00    Average packs/day: 1 pack/day for 10.0 years (10.0 ttl  pk-yrs)    Types: Cigarettes    Start date: 11/26/1972    Quit date: 12/27/1977    Years since quitting: 46.6   Smokeless tobacco: Never  Substance and Sexual Activity   Alcohol use: Never   Drug use: Never   Sexual activity: Not Currently    Birth control/protection: None  Other Topics Concern   Not on file  Social History Narrative   Not on file   Social Drivers of Health   Financial Resource Strain: Low Risk  (08/26/2024)   Overall Financial Resource Strain (CARDIA)    Difficulty of Paying Living Expenses: Not hard at all  Food Insecurity: No Food Insecurity (08/26/2024)   Hunger Vital Sign    Worried About Running Out of Food in the Last Year: Never true    Ran Out of Food in the Last Year: Never true  Transportation Needs: No Transportation Needs (08/26/2024)   PRAPARE - Administrator, Civil Service (Medical): No    Lack of Transportation (Non-Medical): No  Physical Activity: Sufficiently Active (08/26/2024)   Exercise Vital Sign    Days of Exercise per Week: 7 days    Minutes of Exercise per Session: 30 min  Stress: No Stress Concern Present (08/26/2024)   [employer] of Occupational Health - Occupational Stress Questionnaire    Feeling of Stress: Not at all  Social Connections: Socially Integrated (08/26/2024)   Social Connection and Isolation Panel    Frequency of Communication with Friends and Family: Twice a week    Frequency of Social Gatherings with Friends and Family: More than three times a week    Attends Religious Services: 1 to 4 times per year    Active Member of [employer] or Organizations: Yes    Attends Banker Meetings: 1 to 4 times per year    Marital Status: Married    Tobacco Counseling Counseling given: Not Answered    Clinical Intake:  Pre-visit preparation completed: Yes  Pain : No/denies pain     BMI - recorded: 25.09 Nutritional Status: BMI 25 -29 Overweight Nutritional Risks: None Diabetes: No  No results  found for: HGBA1C   How often do you need to have someone help you when you read instructions, pamphlets, or other written materials from your doctor or pharmacy?: 1 - Never  Interpreter Needed?: No  Information entered by :: Ellouise Haws, LPN   Activities of Daily Living     08/22/2024    8:58 AM  In your present state of health, do you have any difficulty performing the following activities:  Hearing? 0  Vision? 0  Difficulty concentrating or making decisions? 0  Walking or climbing stairs? 0  Dressing or bathing? 0  Doing errands, shopping? 0  Preparing Food and eating ? N  Using the Toilet? N  In the past six months, have you accidently leaked urine? N  Do you have problems with loss of bowel control? N  Managing your Medications? N  Managing your Finances? N  Housekeeping or managing your Housekeeping? N    Patient Care Team: Jodie Lavern CROME, MD as PCP - General (Family Medicine) Nicholaus Tanda CROME DOUGLAS, MD as Consulting Physician (Urology) Cloretta Arley NOVAK, MD as Consulting Physician (Oncology) Vernetta Berg, MD as Consulting Physician (General Surgery) McDonald, Juliene SAUNDERS, DPM as Consulting Physician (Podiatry)  I have updated your Care Teams any recent Medical Services you may have received from other providers in the past year.     Assessment:   This is a routine wellness examination for Derek Brown.  Hearing/Vision screen Hearing Screening - Comments:: Pt denies any hearing issues  Vision Screening - Comments:: Wears rx glasses - up to date with routine eye exams with Dr Ginnie pinal    Goals Addressed             This Visit's Progress    Patient Stated       Maintain health and activity        Depression Screen     08/26/2024    1:46 PM 07/29/2024   11:13 AM 07/01/2024    9:15 AM 02/18/2024    9:40 AM  PHQ 2/9 Scores  PHQ - 2 Score 0 0 0 0  PHQ- 9 Score    0    Fall Risk     08/26/2024    1:47 PM 08/22/2024    8:58 AM 07/29/2024   11:13 AM 07/01/2024     9:13 AM 05/11/2024    8:52 AM  Fall Risk   Falls in the past year? 0 0 0 0 0  Number falls in past yr: 0 0 0 0   Injury with Fall? 0 0 0 0   Risk for fall due to : No Fall Risks  No Fall Risks No Fall Risks   Follow up Falls prevention discussed  Falls evaluation completed Falls evaluation completed     MEDICARE RISK AT HOME:  Medicare Risk at Home Any stairs in or around the home?: (Patient-Rptd) Yes If so, are there any without handrails?: (Patient-Rptd) No Home free of loose throw rugs in walkways, pet beds, electrical cords, etc?: (Patient-Rptd) Yes Adequate lighting in your home to reduce risk of falls?: (Patient-Rptd) Yes Life alert?: (Patient-Rptd) No Use of a cane, walker or w/c?: (Patient-Rptd) No Grab bars in the bathroom?: (Patient-Rptd) No Shower chair or bench in shower?: (Patient-Rptd) No Elevated toilet seat or a handicapped toilet?: (Patient-Rptd) No  TIMED UP AND GO:  Was the test performed?  No  Cognitive Function: 6CIT completed        08/26/2024    1:47 PM  6CIT Screen  What Year? 0 points  What month? 0 points  What time? 0 points  Count back from 20 0 points  Months in reverse 0 points  Repeat phrase 0 points  Total Score 0 points    Immunizations Immunization History  Administered Date(s) Administered   Fluad Quad(high Dose 65+) 09/08/2016   Influenza Inj Mdck Quad Pf 09/05/2017, 07/14/2018   Influenza Split 08/26/2012, 08/06/2013, 07/25/2024   Influenza, Seasonal, Injecte, Preservative Fre 11/03/2015   Influenza,inj,Quad PF,6+ Mos 08/29/2014, 11/03/2015, 09/08/2016   Influenza,trivalent, recombinat, inj, PF 08/26/2012, 08/06/2013   Influenza-Unspecified 07/27/2018, 07/27/2022   PFIZER(Purple Top)SARS-COV-2 Vaccination 02/01/2020, 02/24/2020, 08/09/2020   PNEUMOCOCCAL CONJUGATE-20 07/01/2024   Pfizer Covid-19 Vaccine  Bivalent Booster 20yrs & up 08/25/2022   Pfizer(Comirnaty)Fall Seasonal Vaccine 12 years and older 08/10/2024    Pneumococcal Polysaccharide-23 11/03/2015   Respiratory Syncytial Virus Vaccine,Recomb Aduvanted(Arexvy) 09/12/2022   Tdap 12/14/2015, 02/03/2018   Zoster Recombinant(Shingrix) 07/25/2024   Zoster, Live 12/26/2016    Screening Tests Health Maintenance  Topic Date Due   Zoster Vaccines- Shingrix (2 of 2) 09/19/2024   COVID-19 Vaccine (6 - Pfizer risk 2024-25 season) 02/07/2025   Medicare Annual Wellness (AWV)  08/26/2025   DTaP/Tdap/Td (3 - Td or Tdap) 02/04/2028   Colonoscopy  02/24/2033   Pneumococcal Vaccine: 50+ Years  Completed   Influenza Vaccine  Completed   Hepatitis C Screening  Completed   HPV VACCINES  Aged Out   Meningococcal B Vaccine  Aged Out    Health Maintenance Items Addressed: See Nurse Notes at the end of this note  Additional Screening:  Vision Screening: Recommended annual ophthalmology exams for early detection of glaucoma and other disorders of the eye. Is the patient up to date with their annual eye exam?  Yes  Who is the provider or what is the name of the office in which the patient attends annual eye exams? DR Ginnie Pinal   Dental Screening: Recommended annual dental exams for proper oral hygiene  Community Resource Referral / Chronic Care Management: CRR required this visit?  No   CCM required this visit?  No   Plan:    I have personally reviewed and noted the following in the patient's chart:   Medical and social history Use of alcohol, tobacco or illicit drugs  Current medications and supplements including opioid prescriptions. Patient is not currently taking opioid prescriptions. Functional ability and status Nutritional status Physical activity Advanced directives List of other physicians Hospitalizations, surgeries, and ER visits in previous 12 months Vitals Screenings to include cognitive, depression, and falls Referrals and appointments  In addition, I have reviewed and discussed with patient certain preventive protocols,  quality metrics, and best practice recommendations. A written personalized care plan for preventive services as well as general preventive health recommendations were provided to patient.   Ellouise VEAR Haws, LPN   [id number]/06/[id number]   After Visit Summary: (MyChart) Due to this being a telephonic visit, the after visit summary with patients personalized plan was offered to patient via MyChart   Notes: Nothing significant to report at this time.

## 2024-08-26 NOTE — Patient Instructions (Signed)
 Derek Brown,  Thank you for taking the time for your Medicare Wellness Visit. I appreciate your continued commitment to your health goals. Please review the care plan we discussed, and feel free to reach out if I can assist you further.  Medicare recommends these wellness visits once per year to help you and your care team stay ahead of potential health issues. These visits are designed to focus on prevention, allowing your provider to concentrate on managing your acute and chronic conditions during your regular appointments.  Please note that Annual Wellness Visits do not include a physical exam. Some assessments may be limited, especially if the visit was conducted virtually. If needed, we may recommend a separate in-person follow-up with your provider.  Ongoing Care Seeing your primary care provider every 3 to 6 months helps us  monitor your health and provide consistent, personalized care.   Referrals If a referral was made during today's visit and you haven't received any updates within two weeks, please contact the referred provider directly to check on the status.  Recommended Screenings:  Health Maintenance  Topic Date Due   Medicare Annual Wellness Visit  Never done   Zoster (Shingles) Vaccine (2 of 2) 09/19/2024   COVID-19 Vaccine (6 - Pfizer risk 2024-25 season) 02/07/2025   DTaP/Tdap/Td vaccine (3 - Td or Tdap) 02/04/2028   Colon Cancer Screening  02/24/2033   Pneumococcal Vaccine for age over 24  Completed   Flu Shot  Completed   Hepatitis C Screening  Completed   HPV Vaccine  Aged Out   Meningitis B Vaccine  Aged Out       09/27/2023    8:22 AM  Advanced Directives  Does Patient Have a Medical Advance Directive? No  Would patient like information on creating a medical advance directive? No - Patient declined   Advance Care Planning is important because it: Ensures you receive medical care that aligns with your values, goals, and preferences. Provides guidance to your  family and loved ones, reducing the emotional burden of decision-making during critical moments.  Vision: Annual vision screenings are recommended for early detection of glaucoma, cataracts, and diabetic retinopathy. These exams can also reveal signs of chronic conditions such as diabetes and high blood pressure.  Dental: Annual dental screenings help detect early signs of oral cancer, gum disease, and other conditions linked to overall health, including heart disease and diabetes.  Please see the attached documents for additional preventive care recommendations.

## 2024-08-26 NOTE — Telephone Encounter (Signed)
 Copied from CRM #[id number]. Topic: Clinical - Lab/Test Results >> Aug 20, 2024  3:22 PM Isabell A wrote: Reason for CRM: Patient returning phone call from [facility] in regard to sleep study.   Callback number: [phone] >> Aug 26, 2024 11:34 AM Corean SAUNDERS wrote: Patient returning call from [facility] regarding is sleep study results. E2C2 agent attempted to call [facility] with no success. Patient is kindly requesting a return call when available.  >> Aug 24, 2024  4:28 PM Chantha C wrote: Patient is returning the office call, unsure, he think it's sleep study results. Please call back [phone].    Spoke with patient verbalized understanding and order has been placed.   - NFN

## 2024-09-01 ENCOUNTER — Other Ambulatory Visit (HOSPITAL_COMMUNITY): Payer: Self-pay | Admitting: Surgery

## 2024-09-01 ENCOUNTER — Other Ambulatory Visit: Payer: Self-pay | Admitting: Surgery

## 2024-09-01 DIAGNOSIS — R1905 Periumbilic swelling, mass or lump: Secondary | ICD-10-CM

## 2024-09-05 ENCOUNTER — Ambulatory Visit (HOSPITAL_COMMUNITY)
Admission: RE | Admit: 2024-09-05 | Discharge: 2024-09-05 | Disposition: A | Discharge status: Home / Self Care | Source: Ambulatory Visit | Attending: Surgery | Admitting: Surgery

## 2024-09-05 DIAGNOSIS — R1905 Periumbilic swelling, mass or lump: Secondary | ICD-10-CM | POA: Insufficient documentation

## 2024-09-14 ENCOUNTER — Telehealth: Payer: Self-pay | Admitting: Oncology

## 2024-09-14 NOTE — Telephone Encounter (Signed)
 PT called to rescheduled appt. PT will be in [place].

## 2024-09-28 ENCOUNTER — Ambulatory Visit: Payer: 59 | Admitting: Oncology

## 2024-09-29 ENCOUNTER — Ambulatory Visit: Admitting: Oncology

## 2024-10-01 ENCOUNTER — Inpatient Hospital Stay: Attending: Oncology | Admitting: Oncology

## 2024-10-01 VITALS — BP 130/69 | HR 60 | Temp 98.0°F | Resp 18 | Ht 72.0 in | Wt 187.3 lb

## 2024-10-01 DIAGNOSIS — C8208 Follicular lymphoma grade I, lymph nodes of multiple sites: Secondary | ICD-10-CM | POA: Diagnosis not present

## 2024-10-01 DIAGNOSIS — C829A Follicular lymphoma, unspecified, in remission: Secondary | ICD-10-CM | POA: Insufficient documentation

## 2024-10-01 NOTE — Progress Notes (Signed)
  [facility] OFFICE PROGRESS NOTE   Diagnosis: Non-Hodgkin's lymphoma  INTERVAL HISTORY:   Mr. Derek Brown returns as scheduled.  He feels well.  No palpable lymph nodes or scalp lesion.  No fever or night sweats.  Good appetite.  He is working.  He is seeing Dr. Vernetta for small periumbilical hernias.  Objective:  Vital signs in last 24 hours:  Blood pressure 130/69, pulse 60, temperature 98 F (36.7 C), temperature source Temporal, resp. rate 18, height 6' (1.829 m), weight 187 lb 4.8 oz (85 kg), SpO2 98%.   Lymphatics: No cervical, supraclavicular, axillary, or inguinal nodes Resp: Lungs clear bilaterally Cardio: Regular rate and rhythm GI: No hepatosplenomegaly, no mass, nontender, small right periumbilical hernia? Vascular: No leg edema  Skin: No evidence of recurrent tumor at the left parietal scalp   Lab Results:  Lab Results  Component Value Date   WBC 3.6 (L) 07/01/2024   HGB 14.1 07/01/2024   HCT 43.0 07/01/2024   MCV 94.8 07/01/2024   PLT 165.0 07/01/2024   NEUTROABS 2.2 07/01/2024    CMP  Lab Results  Component Value Date   NA 139 07/01/2024   K 4.4 07/01/2024   CL 104 07/01/2024   CO2 29 07/01/2024   GLUCOSE 105 (H) 07/01/2024   BUN 12 07/01/2024   CREATININE 0.83 07/01/2024   CALCIUM 9.3 07/01/2024   PROT 6.9 07/01/2024   ALBUMIN 4.3 07/01/2024   AST 23 07/01/2024   ALT 23 07/01/2024   ALKPHOS 43 07/01/2024   BILITOT 0.6 07/01/2024   GFRNONAA >60 09/20/2016   GFRAA >60 09/20/2016     Medications: I have reviewed the patient's current medications.   Assessment/Plan:  Low-grade follicular lymphoma diagnosed on biopsy of a right submandibular lymph node and left intraparotid node December 2005. Treated with rituximab, last given on 04/30/2008. The palpable lymphadenopathy resolved following rituximab therapy. Benign prostatic hypertrophy. Admission with a lower gastrointestinal bleed, status post a colonoscopy revealing  diverticulosis in August 2009. History of mild neutropenia related to rituximab. Removal of a left parietal scalp lesion in February 2013 with the pathology confirming non-Hodgkin's lymphoma-low-grade 2/3 follicular lymphoma Chronic bradycardia Subdural hematomas fall 2017-likely related to a fall Hypertension    Disposition: Mr Arch has a history of follicular lymphoma.  He is in clinical remission.  He will return for an office visit in 1 year.  He will call in the interim for new symptoms.  Arley Hof, MD  10/01/2024  3:43 PM

## 2024-11-06 ENCOUNTER — Ambulatory Visit: Admitting: Adult Health

## 2024-11-06 ENCOUNTER — Encounter: Payer: Self-pay | Admitting: Adult Health

## 2024-11-06 VITALS — BP 162/80 | HR 52 | Temp 97.6°F | Ht 72.0 in | Wt 191.4 lb

## 2024-11-06 DIAGNOSIS — Z87891 Personal history of nicotine dependence: Secondary | ICD-10-CM

## 2024-11-06 DIAGNOSIS — G4733 Obstructive sleep apnea (adult) (pediatric): Secondary | ICD-10-CM

## 2024-11-06 NOTE — Progress Notes (Signed)
 @Patient  ID: Derek Brown, male    DOB: 1955/12/16, 68 y.o.   MRN: [id number]  Chief Complaint  Patient presents with   Obstructive Sleep Apnea    F/u    Referring provider: Jodie Lavern CROME, MD  HPI: 68 year old male followed for severe obstructive sleep apnea Diagnosed with severe sleep apnea in October 2024 used CPAP initially but had difficulty tolerating Medical history significant for hypertension and lymphoma    TEST/EVENTS : Reviewed 11/06/2024  Home sleep study September 22, 2023 showed severe sleep apnea with AHI of 48.4/hour and SpO2 low at 80%   Titration study : Sleep study best controlled on BIPAP ;  use autobilevel PS +4, EPAP 10, IPAP max 20    Discussed the use of AI scribe software for clinical note transcription with the patient, who gave verbal consent to proceed.  History of Present Illness Derek Brown is a 68 year old male with sleep apnea who presents for a follow-up regarding BiPAP therapy. Patient returns after recently starting on BiPAP therapy.  Patient had difficulty tolerating CPAP.  He underwent a titration study that showed optimal control on BiPAP.  With IPAP max at 20 and EPAP minimum at 10 cm of H2O.  Patient says he is tolerating much better.  He does feel like the mask does not fit quite as good sometimes and his mouth opens but overall is doing much better.  Feels that he is benefiting from BiPAP with decreased daytime sleepiness.  Feels that he has a little bit more energy BiPAP download shows excellent 100% compliance.  Daily average usage at 4.5 hours.  He is on auto BiPAP IPAP max 20 cm H2O and EPAP minimum 10 cm H2O.  AHI 3.5/hour.  Positive mask leaks  He transitioned from CPAP to BiPAP therapy after a titration study, and he feels better with BiPAP.  Despite the mask leakage, he feels the BiPAP is still effective as he continues to receive airflow through his nose.  He is currently averaging about four hours of sleep per night with the  BiPAP, but he is working towards increasing this to six hours. He notes that when he wakes up after using the BiPAP, he feels more awake compared to when he sleeps without it. He has also noticed an improvement in nasal dryness and drainage since switching to BiPAP.      Allergies[1]  Immunization History  Administered Date(s) Administered   Fluad Quad(high Dose 65+) 09/08/2016   Influenza Inj Mdck Quad Pf 09/05/2017, 07/14/2018   Influenza Split 08/26/2012, 08/06/2013, 07/25/2024   Influenza, Seasonal, Injecte, Preservative Fre 11/03/2015   Influenza,inj,Quad PF,6+ Mos 08/29/2014, 11/03/2015, 09/08/2016   Influenza,trivalent, recombinat, inj, PF 08/26/2012, 08/06/2013   Influenza-Unspecified 07/27/2018, 07/27/2022   PFIZER(Purple Top)SARS-COV-2 Vaccination 02/01/2020, 02/24/2020, 08/09/2020   PNEUMOCOCCAL CONJUGATE-20 07/01/2024   Pfizer Covid-19 Vaccine Bivalent Booster 24yrs & up 08/25/2022   Pfizer(Comirnaty)Fall Seasonal Vaccine 12 years and older 08/10/2024   Pneumococcal Polysaccharide-23 11/03/2015   Respiratory Syncytial Virus Vaccine,Recomb Aduvanted(Arexvy) 09/12/2022   Tdap 12/14/2015, 02/03/2018   Zoster Recombinant(Shingrix) 07/25/2024   Zoster, Live 12/26/2016    Past Medical History:  Diagnosis Date   Cataract    Hypertension    Non-Hodgkin lymphoma (HCC)    Right inguinal hernia 07/26/2014   Sleep apnea     Tobacco History: Tobacco Use History[2] Counseling given: Not Answered   Outpatient Medications Prior to Visit  Medication Sig Dispense Refill   losartan  (COZAAR ) 100 MG tablet Take 1 tablet (  100 mg total) by mouth at bedtime. 90 tablet 3   Multiple Vitamin (MULTIVITAMIN) tablet Take 1 tablet by mouth daily.     fluticasone  (FLONASE ) 50 MCG/ACT nasal spray Place 1 spray into both nostrils daily. (Patient not taking: Reported on 10/01/2024) 16 g 6   No facility-administered medications prior to visit.     Review of Systems:   Constitutional:    No  weight loss, night sweats,  Fevers, chills, fatigue, or  lassitude.  HEENT:   No headaches,  Difficulty swallowing,  Tooth/dental problems, or  Sore throat,                No sneezing, itching, ear ache, nasal congestion, post nasal drip,   CV:  No chest pain,  Orthopnea, PND, swelling in lower extremities, anasarca, dizziness, palpitations, syncope.   GI  No heartburn, indigestion, abdominal pain, nausea, vomiting, diarrhea, change in bowel habits, loss of appetite, bloody stools.   Resp: No shortness of breath with exertion or at rest.  No excess mucus, no productive cough,  No non-productive cough,  No coughing up of blood.  No change in color of mucus.  No wheezing.  No chest wall deformity  Skin: no rash or lesions.  GU: no dysuria, change in color of urine, no urgency or frequency.  No flank pain, no hematuria   MS:  No joint pain or swelling.  No decreased range of motion.  No back pain.    Physical Exam  BP (!) 162/80   Pulse (!) 52   Temp 97.6 F (36.4 C)   Ht 6' (1.829 m) Comment: Per pt  Wt 191 lb 6.4 oz (86.8 kg)   SpO2 94% Comment: RA  BMI 25.96 kg/m   GEN: A/Ox3; pleasant , NAD, well nourished    HEENT:  [place]/AT,, NOSE-clear, THROAT-clear, no lesions, no postnasal drip or exudate noted.   NECK:  Supple w/ fair ROM; no JVD; normal carotid impulses w/o bruits; no thyromegaly or nodules palpated; no lymphadenopathy.    RESP  Clear  P & A; w/o, wheezes/ rales/ or rhonchi. no accessory muscle use, no dullness to percussion  CARD:  RRR, no m/r/g, no peripheral edema, pulses intact, no cyanosis or clubbing.  GI:   Soft & nt; nml bowel sounds; no organomegaly or masses detected.   Musco: Warm bil, no deformities or joint swelling noted.   Neuro: alert, no focal deficits noted.    Skin: Warm, no lesions or rashes    Lab Results:Reviewed 11/06/2024   CBC    Component Value Date/Time   WBC 3.6 (L) 07/01/2024 1009   RBC 4.53 07/01/2024 1009   HGB 14.1  07/01/2024 1009   HGB 14.6 10/22/2008 0915   HCT 43.0 07/01/2024 1009   HCT 42.2 10/22/2008 0915   PLT 165.0 07/01/2024 1009   PLT 183 10/22/2008 0915   MCV 94.8 07/01/2024 1009   MCV 90.5 10/22/2008 0915   MCH 31.4 09/20/2016 1156   MCHC 32.8 07/01/2024 1009   RDW 14.2 07/01/2024 1009   RDW 13.0 10/22/2008 0915   LYMPHSABS 0.9 07/01/2024 1009   LYMPHSABS 0.9 10/22/2008 0915   MONOABS 0.4 07/01/2024 1009   MONOABS 0.4 10/22/2008 0915   EOSABS 0.1 07/01/2024 1009   EOSABS 0.1 10/22/2008 0915   BASOSABS 0.0 07/01/2024 1009   BASOSABS 0.0 10/22/2008 0915    BMET    Component Value Date/Time   NA 139 07/01/2024 1009   K 4.4 07/01/2024 1009   CL  104 07/01/2024 1009   CO2 29 07/01/2024 1009   GLUCOSE 105 (H) 07/01/2024 1009   BUN 12 07/01/2024 1009   CREATININE 0.83 07/01/2024 1009   CALCIUM 9.3 07/01/2024 1009   GFRNONAA >60 09/20/2016 1156   GFRAA >60 09/20/2016 1156    BNP No results found for: BNP  ProBNP No results found for: PROBNP  Imaging: No results found.  Administration History     None           No data to display          No results found for: NITRICOXIDE      No data to display              Assessment & Plan:   Assessment and Plan Assessment & Plan Obstructive sleep apnea  -severe obstructive sleep apnea with improved symptom management and control on BiPAP.  Recent titration study showed improved tolerance and improved control on BiPAP We discussed alternative mask and possible chinstrap however patient wants to continue with his current mask as he feels that it is working well currently.  He is encouraged to increase sleep duration to over six hours per night Continue BiPAP therapy. Add saline nasal spray twice daily and saline nasal gel at bedtime to moisturize nasal passages.  BiPAP care discussed in detail  Plan  Patient Instructions  Continue on BIPAP At bedtime,  Keep up good work.  Try to increase usage to 6hr or  more.  Saline nasal spray Twice daily   Saline nasal gel At bedtime   Healthy sleep regimen  Do not drive if sleepy  Follow up in 4-6 months with Dr. Neda or Puneet Masoner NP and As needed            Madelin Stank, NP 11/06/2024     [1] No Known Allergies [2]  Social History Tobacco Use  Smoking Status Former   Current packs/day: 0.00   Average packs/day: 1 pack/day for 10.0 years (10.0 ttl pk-yrs)   Types: Cigarettes   Start date: 11/26/1972   Quit date: 12/27/1977   Years since quitting: 46.8  Smokeless Tobacco Never

## 2024-11-06 NOTE — Patient Instructions (Signed)
 Continue on BIPAP At bedtime,  Keep up good work.  Try to increase usage to 6hr or more.  Saline nasal spray Twice daily   Saline nasal gel At bedtime   Healthy sleep regimen  Do not drive if sleepy  Follow up in 4-6 months with Dr. Neda or Derek Weatherbee NP and As needed

## 2024-12-16 ENCOUNTER — Telehealth: Payer: Self-pay

## 2024-12-16 ENCOUNTER — Other Ambulatory Visit: Payer: Self-pay

## 2024-12-16 NOTE — Telephone Encounter (Signed)
 Copied from CRM #[id number]. Topic: Clinical - Order For Equipment >> Dec 16, 2024  9:35 AM Wyona SQUIBB wrote: Reason for CRM:  Patient has been having a cold since the beginning of January and would like to know if can be prescribed an antibiotic.   If possible would like it to go to pharamac[facility]   [id number] - [place], [place] - [address] AT [facility] [address]  [address] [place] [address]  Phone: [phone] Fax: [phone]  Hours: Open 24 hours   Please call patient once done or if there is any further questions call back #[phone]   Please contact patient and advise appt is needed in office for antibiotics to be prescribed. Please schedule an appt for patient to be evaluated

## 2024-12-16 NOTE — Telephone Encounter (Signed)
 Pt wanted to wait to see Jodie. Scheduled for 12/18/24

## 2024-12-18 ENCOUNTER — Telehealth: Payer: Self-pay | Admitting: *Deleted

## 2024-12-18 ENCOUNTER — Ambulatory Visit (INDEPENDENT_AMBULATORY_CARE_PROVIDER_SITE_OTHER): Admitting: Family Medicine

## 2024-12-18 ENCOUNTER — Encounter: Payer: Self-pay | Admitting: Family Medicine

## 2024-12-18 VITALS — BP 130/70 | HR 59 | Temp 97.8°F | Ht 72.0 in | Wt 188.6 lb

## 2024-12-18 DIAGNOSIS — I10 Essential (primary) hypertension: Secondary | ICD-10-CM

## 2024-12-18 DIAGNOSIS — R051 Acute cough: Secondary | ICD-10-CM | POA: Diagnosis not present

## 2024-12-18 DIAGNOSIS — J208 Acute bronchitis due to other specified organisms: Secondary | ICD-10-CM

## 2024-12-18 DIAGNOSIS — Z8572 Personal history of non-Hodgkin lymphomas: Secondary | ICD-10-CM

## 2024-12-18 DIAGNOSIS — B9689 Other specified bacterial agents as the cause of diseases classified elsewhere: Secondary | ICD-10-CM

## 2024-12-18 LAB — POC COVID19 BINAXNOW: SARS Coronavirus 2 Ag: NEGATIVE

## 2024-12-18 MED ORDER — AZITHROMYCIN 250 MG PO TABS: ORAL_TABLET | ORAL | 0 refills | Status: AC

## 2024-12-18 NOTE — Progress Notes (Signed)
 "  Subjective  CC:  Chief Complaint  Patient presents with   Cough    Pt stated that he has been having a cough for the past 2 weeks but the cough got better and then it came back. He seems that it is worse now but he just can't get rid of it     HPI: Derek Brown is a 69 y.o. male who presents to the office today to address the problems listed above in the chief complaint. Discussed the use of AI scribe software for clinical note transcription with the patient, who gave verbal consent to proceed.  History of Present Illness CHARELS STAMBAUGH is a 69 year old male who presents with a persistent cough following a recent upper respiratory infection.  Cough - Persistent cough developed approximately one week ago following upper respiratory infection. - Cough characterized by episodic fits, particularly at night. - Cough occasionally feels like it is in the chest when lying down at night. - No significant sputum production. - Cough partially controlled with high-strength Mucinex, including 'D' formulation.  Upper respiratory symptoms - Initial symptoms began the Friday after New Year's with sneezing and nasal congestion. - Symptoms worsened over the weekend and improved over the following week. - Initial runny nose with nasal discharge, now resolved. - No sore throat, sinus or facial pain.  Associated symptoms - No fever, body aches, or shortness of breath.  White coat htn: on meds, ill and was rushing. Home readings remain controlled.   Assessment  1. Acute cough   2. Acute bacterial bronchitis   3. History of non-Hodgkin's lymphoma   4. White coat syndrome with diagnosis of hypertension      Plan  Assessment and Plan Assessment & Plan Acute bronchitis Likely viral etiology, presenting with persistent cough and sneezing. Symptoms initially improved but recurred with a persistent cough. No fever, body aches, or shortness of breath. Lungs are clear on examination. Differential  includes viral infection. COVID-19 and influenza tests are negative. RSV is considered but not confirmed. - Continue Mucinex DM for cough management. - Prescribed antibiotic to address persistent cough.    Follow up: prn Orders Placed This Encounter  Procedures   POC COVID-19   Meds ordered this encounter  Medications   azithromycin (ZITHROMAX) 250 MG tablet    Sig: Take 2 tabs today, then 1 tab daily for 4 days    Dispense:  1 each    Refill:  0     I reviewed the patients updated PMH, FH, and SocHx.  Patient Active Problem List   Diagnosis Date Noted   White coat syndrome with diagnosis of hypertension 02/18/2024    Priority: High   Elevated prostate specific antigen (PSA) 07/23/2016    Priority: High   Benign nodular prostatic hyperplasia with lower urinary tract symptoms 07/26/2014    Priority: High   Other malignant lymphomas, unspecified site, extranodal and solid organ sites 01/15/2014    Priority: High   Follicular lymphoma grade II of lymph nodes of head, face, and neck (HCC) 07/19/2012    Priority: High   History of non-Hodgkin's lymphoma 04/04/2010    Priority: High   History of subdural hematoma 02/18/2024    Priority: Medium    History of GI diverticular bleed 02/18/2024    Priority: Medium    Sinus bradycardia 12/26/2016    Priority: Medium    Scrotal varices 07/26/2014    Priority: Low   Allergic rhinitis 04/04/2010    Priority:  Low   History of alcoholism (HCC) 02/21/2009    Priority: Low   OSA (obstructive sleep apnea) 03/05/2024   Active Medications[1] Allergies: Patient has no known allergies. Family History: Patient family history includes Breast cancer in his mother; Depression in his maternal grandfather; Heart disease (age of onset: 69) in his father; Hypertension in his brother and father. Social History:  Patient  reports that he quit smoking about 47 years ago. His smoking use included cigarettes. He started smoking about 52 years ago.  He has a 10 pack-year smoking history. He has never used smokeless tobacco. He reports that he does not drink alcohol and does not use drugs.  Review of Systems: Constitutional: Negative for fever malaise or anorexia Cardiovascular: negative for chest pain Respiratory: negative for SOB or persistent cough Gastrointestinal: negative for abdominal pain  Objective  Vitals: BP 130/70 Comment: home readings when not sick  Pulse (!) 59   Temp 97.8 F (36.6 C)   Ht 6' (1.829 m)   Wt 188 lb 9.6 oz (85.5 kg)   SpO2 98%   BMI 25.58 kg/m  General: no acute distress , A&Ox3 HEENT: [place], conjunctiva normal, neck is supple Cardiovascular:  RRR without murmur or gallop.  Respiratory:  Good breath sounds bilaterally, CTAB with normal respiratory effort Office Visit on 12/18/2024  Component Date Value Ref Range Status   SARS Coronavirus 2 Ag 12/18/2024 Negative  Negative Final    Commons side effects, risks, benefits, and alternatives for medications and treatment plan prescribed today were discussed, and the patient expressed understanding of the given instructions. Patient is instructed to call or message via MyChart if he/she has any questions or concerns regarding our treatment plan. No barriers to understanding were identified. We discussed Red Flag symptoms and signs in detail. Patient expressed understanding regarding what to do in case of urgent or emergency type symptoms.  Medication list was reconciled, printed and provided to the patient in AVS. Patient instructions and summary information was reviewed with the patient as documented in the AVS. This note was prepared with assistance of Dragon voice recognition software. Occasional wrong-word or sound-a-like substitutions may have occurred due to the inherent limitations of voice recognition software    [1]  Current Meds  Medication Sig   azithromycin (ZITHROMAX) 250 MG tablet Take 2 tabs today, then 1 tab daily for 4 days   losartan   (COZAAR ) 100 MG tablet Take 1 tablet (100 mg total) by mouth at bedtime.   Multiple Vitamin (MULTIVITAMIN) tablet Take 1 tablet by mouth daily.   "

## 2024-12-18 NOTE — Telephone Encounter (Signed)
 Pt saw pcp today and was placed on abx. He wanted documented that he's been sick.  Copied from CRM [id number]. Topic: Clinical - Order For Equipment >> Dec 15, 2024  9:44 AM Rozanna MATSU wrote: Reason for CRM: pt is calling wanting Tammy to know he has not used his Bipap machine in a couple weeks because he had a cold and wants someone call him to advise him what he should do so they want try to come get his machine.

## 2025-04-08 ENCOUNTER — Ambulatory Visit: Admitting: Adult Health

## 2025-07-06 ENCOUNTER — Encounter: Admitting: Family Medicine

## 2025-08-30 ENCOUNTER — Ambulatory Visit

## 2025-09-01 ENCOUNTER — Ambulatory Visit

## 2025-09-30 ENCOUNTER — Inpatient Hospital Stay: Admitting: Oncology
